# Patient Record
Sex: Female | Born: 1975 | Race: Black or African American | Hispanic: No | Marital: Single | Smoking: Never smoker
Health system: Southern US, Community
[De-identification: ages and names within clinical notes are randomized; demographics above are authoritative.]

## PROBLEM LIST (undated history)

## (undated) DIAGNOSIS — K219 Gastro-esophageal reflux disease without esophagitis: Secondary | ICD-10-CM

## (undated) DIAGNOSIS — G4730 Sleep apnea, unspecified: Secondary | ICD-10-CM

## (undated) DIAGNOSIS — M1990 Unspecified osteoarthritis, unspecified site: Secondary | ICD-10-CM

## (undated) DIAGNOSIS — J45909 Unspecified asthma, uncomplicated: Secondary | ICD-10-CM

## (undated) HISTORY — PX: FOOT SURGERY: SHX648

## (undated) HISTORY — PX: OTHER SURGICAL HISTORY: SHX169

## (undated) HISTORY — PX: BREAST BIOPSY: SHX20

## (undated) HISTORY — DX: Unspecified asthma, uncomplicated: J45.909

## (undated) HISTORY — DX: Unspecified osteoarthritis, unspecified site: M19.90

## (undated) NOTE — *Deleted (*Deleted)
It was great to see you!  Our plans for today:  - *** -   We are checking some labs today, I will call you if they are abnormal will send you a MyChart message or a letter if they are normal.  If you do not hear about your labs in the next 2 weeks please let us know.***  Take care and seek immediate care sooner if you develop any concerns.   Dr. [facility]

---

## 2010-06-30 HISTORY — PX: MYOMECTOMY: SHX85

## 2019-08-17 ENCOUNTER — Other Ambulatory Visit: Payer: Self-pay

## 2019-08-17 ENCOUNTER — Ambulatory Visit: Payer: Medicaid Other | Admitting: Family Medicine

## 2019-09-16 ENCOUNTER — Other Ambulatory Visit: Payer: Self-pay | Admitting: Family Medicine

## 2019-09-16 ENCOUNTER — Encounter: Payer: Self-pay | Admitting: Family Medicine

## 2019-09-16 ENCOUNTER — Ambulatory Visit (INDEPENDENT_AMBULATORY_CARE_PROVIDER_SITE_OTHER): Payer: Medicaid Other | Admitting: Family Medicine

## 2019-09-16 ENCOUNTER — Other Ambulatory Visit: Payer: Self-pay

## 2019-09-16 ENCOUNTER — Telehealth: Payer: Self-pay

## 2019-09-16 VITALS — BP 132/70 | HR 86 | Ht 59.0 in | Wt 161.5 lb

## 2019-09-16 DIAGNOSIS — Z9889 Other specified postprocedural states: Secondary | ICD-10-CM | POA: Diagnosis not present

## 2019-09-16 DIAGNOSIS — N911 Secondary amenorrhea: Secondary | ICD-10-CM | POA: Diagnosis not present

## 2019-09-16 DIAGNOSIS — Z8719 Personal history of other diseases of the digestive system: Secondary | ICD-10-CM | POA: Insufficient documentation

## 2019-09-16 DIAGNOSIS — N912 Amenorrhea, unspecified: Secondary | ICD-10-CM

## 2019-09-16 DIAGNOSIS — N63 Unspecified lump in unspecified breast: Secondary | ICD-10-CM

## 2019-09-16 DIAGNOSIS — R109 Unspecified abdominal pain: Secondary | ICD-10-CM | POA: Diagnosis not present

## 2019-09-16 DIAGNOSIS — Z23 Encounter for immunization: Secondary | ICD-10-CM

## 2019-09-16 DIAGNOSIS — Z0184 Encounter for antibody response examination: Secondary | ICD-10-CM

## 2019-09-16 DIAGNOSIS — L659 Nonscarring hair loss, unspecified: Secondary | ICD-10-CM | POA: Diagnosis not present

## 2019-09-16 HISTORY — DX: Unspecified abdominal pain: R10.9

## 2019-09-16 HISTORY — DX: Secondary amenorrhea: N91.1

## 2019-09-16 HISTORY — DX: Unspecified lump in unspecified breast: N63.0

## 2019-09-16 HISTORY — DX: Personal history of other diseases of the digestive system: Z87.19

## 2019-09-16 LAB — POCT URINE PREGNANCY: Preg Test, Ur: NEGATIVE

## 2019-09-16 NOTE — Assessment & Plan Note (Signed)
Reassuring physical exam.  We will follow-up with mammography for further characterization of breast mass.  She reports a known history of a cyst under her right nipple. -Follow-up mammography

## 2019-09-16 NOTE — Assessment & Plan Note (Signed)
Has mild stomach pain and mass with a known history of ventral hernia repair is suspicious for rib currents of her ventral hernia.  No obvious defects noted on palpation. -CT abdomen for further assessment -We will consider surgery referral based on CT results

## 2019-09-16 NOTE — Progress Notes (Signed)
    SUBJECTIVE:   CHIEF COMPLAINT / HPI:   Right breast mass with tenderness One of her biggest concerns for the visit today was a right breast mass in her right upper breast/armpit.  She has noticed this for roughly the past month and is concerned because there is some family history of cancer in the relatives of her mother.  She would like to be worked up for possible breast cancer.  Desire for vaccination Ms. Single is recently moved from [place] and is now trying to find employment medical field.  She is required to show evidence of immunization for MMR, varicella, hepatitis B, tetanus.  She has vaccine records with her today showing previous immunization with MMR and varicella.  She is not sure if she has previously been vaccinated hepatitis B and she does not know when her last tetanus vaccine was.  Ventral abdominal pain She is previously had a ventral hernia repair (unsure if this was with or without mesh).  In the past months, she is begun to notice mild abdominal pain and bulging just above her bellybutton.  The pain is a mild muscular pain is not sharp or excruciating.  She denies any changes in bowel movements.  Amenorrhea She is not sexually active since the birth of her 45-year-old daughter.  She has begun having irregular periods this past year.  PERTINENT  PMH / PSH: History of ventral hernia repair  OBJECTIVE:   BP 132/70   Pulse 86   Ht '4\' 11"'$  (1.499 m)   Wt 161 lb 8 oz (73.3 kg)   LMP 03/31/2019   SpO2 98%   BMI 32.62 kg/m    Breasts: No gross asymmetries noted.  Palpable mass noted in the right axilla to deep palpation.  1-1.5 cm in diameter, soft, mobile.  No skin changes.  No other abnormalities on breast exam bilaterally. Abdomen: Mild bulging of the epigastrium with neck flexion.  Mild tenderness to palpation of her stomach superior to the umbilicus.  Mild mass noted just superior to the umbilicus.  Difficult to palpate any obvious  defect.  ASSESSMENT/PLAN:   Amenorrhea, secondary Did not have time to fully work-up this concern.  She is encouraged to return to clinic at her leisure for a longer discussion and work-up. -We will follow-up urine pregnancy -Return to clinic for further discussion/work-up  Stomach pain Has mild stomach pain and mass with a known history of ventral hernia repair is suspicious for rib currents of her ventral hernia.  No obvious defects noted on palpation. -CT abdomen for further assessment -We will consider surgery referral based on CT results  Breast mass in female Reassuring physical exam.  We will follow-up with mammography for further characterization of breast mass.  She reports a known history of a cyst under her right nipple. -Follow-up mammography   Need for vaccination/immunity testing She is able to provide documentation of previous MMR/varicella vaccines. -MMR and varicella titers drawn today -Tetanus, hepatitis B and flu vaccinations provided today   April Haymaker, MD [place]

## 2019-09-16 NOTE — Patient Instructions (Signed)
I will let you know the results of the blood test that we get today.  I recommend signing up for my chart because I will give you access to the labs that we do today and be an easy way for you to get documentation of your vaccinations and immunity status.  With regard to your breast mass, I think that our findings today in clinic are reassuring.  I do think it is reasonable to get a mammogram and send her to the breast center to get further characterization of the mass.  With regard to your concern for recurrent abdominal hernia, I would like to get an abdominal CT to see if there is truly evidence of a hernia.  If we do find a repeat hernia, we will send you back to a surgeon.

## 2019-09-16 NOTE — Assessment & Plan Note (Addendum)
Did not have time to fully work-up this concern.  She is encouraged to return to clinic at her leisure for a longer discussion and work-up. -We will follow-up urine pregnancy -Return to clinic for further discussion/work-up

## 2019-09-19 LAB — TSH: TSH: 2.34 u[IU]/mL (ref 0.450–4.500)

## 2019-09-19 LAB — QUANTIFERON-TB GOLD PLUS
QuantiFERON Mitogen Value: >10 IU/mL
QuantiFERON Nil Value: 0.09 IU/mL
QuantiFERON TB1 Ag Value: 0.1 IU/mL
QuantiFERON TB2 Ag Value: 0.09 IU/mL
QuantiFERON-TB Gold Plus: NEGATIVE

## 2019-09-19 LAB — MEASLES/MUMPS/RUBELLA IMMUNITY
MUMPS ABS, IGG: >300 AU/mL (ref >10.9)
RUBEOLA AB, IGG: >300 AU/mL (ref >16.4)
Rubella Antibodies, IGG: 11.4 index (ref >0.99)

## 2019-09-19 LAB — VARICELLA ZOSTER ANTIBODY, IGG: Varicella zoster IgG: 786 index (ref >165)

## 2019-09-19 NOTE — Telephone Encounter (Signed)
Call pt and informed of appt. Pt understood. Salvatore Marvel, CMA

## 2019-09-20 ENCOUNTER — Encounter: Payer: Self-pay | Admitting: Family Medicine

## 2019-09-30 ENCOUNTER — Ambulatory Visit
Admission: RE | Admit: 2019-09-30 | Discharge: 2019-09-30 | Disposition: A | Discharge status: Home / Self Care | Payer: Medicaid Other | Source: Ambulatory Visit | Attending: Family Medicine | Admitting: Family Medicine

## 2019-09-30 DIAGNOSIS — Z8719 Personal history of other diseases of the digestive system: Secondary | ICD-10-CM

## 2019-10-03 ENCOUNTER — Telehealth: Payer: Self-pay

## 2019-10-03 NOTE — Telephone Encounter (Signed)
April Hardin from [facility] calls to give the following report:  12 mm nodule with punctate peripheral calcification in the inferior right breast. Mammographic correlation suggested.  PCP has already viewed results and has contacted patient regarding results. Patient is planning on having mammogram done.   To PCP as [name], RN

## 2019-10-10 ENCOUNTER — Telehealth (INDEPENDENT_AMBULATORY_CARE_PROVIDER_SITE_OTHER): Payer: Medicaid Other | Admitting: Family Medicine

## 2019-10-10 ENCOUNTER — Other Ambulatory Visit: Payer: Self-pay

## 2019-10-10 DIAGNOSIS — J45909 Unspecified asthma, uncomplicated: Secondary | ICD-10-CM | POA: Diagnosis not present

## 2019-10-10 DIAGNOSIS — J302 Other seasonal allergic rhinitis: Secondary | ICD-10-CM

## 2019-10-10 MED ORDER — CETIRIZINE HCL 10 MG PO TABS: 10.0000 mg | ORAL_TABLET | Freq: Every day | ORAL | 11 refills | Status: DC

## 2019-10-10 MED ORDER — ALBUTEROL SULFATE HFA 108 (90 BASE) MCG/ACT IN AERS: 2.0000 | INHALATION_SPRAY | Freq: Four times a day (QID) | RESPIRATORY_TRACT | 2 refills | Status: DC | PRN

## 2019-10-10 MED ORDER — OLOPATADINE HCL 0.1 % OP SOLN: 1.0000 [drp] | Freq: Two times a day (BID) | OPHTHALMIC | 12 refills | Status: DC

## 2019-10-10 NOTE — Assessment & Plan Note (Signed)
Mildly worsened in the setting of her recent allergy flare. -Albuterol refilled -She is encouraged to contact the clinic if she continues to need her inhaler 3-4 times a week once her allergies have resolved.

## 2019-10-10 NOTE — Assessment & Plan Note (Signed)
Allergy medications refilled. -Zyrtec -Patanol as needed for eye itchiness

## 2019-10-10 NOTE — Progress Notes (Signed)
[facility] Visit  Patient consented to have virtual visit and was identified by name and date of birth. Method of visit: Video  Encounter participants: Patient: April Hardin - located at home Provider: Matilde Haymaker - located at [facility] Others (if applicable): none  Chief Complaint: allergies  HPI:   Seasonal allergies April Hardin reports that she has had worsened itchy eyes, itchy throat, nasal congestion for the past 5 days.  Of the symptoms appear exactly her previous seasonal allergies.  She is currently taking Claritin with only mild symptom relief.  She was previously taking eyedrops that she has found very helpful.  She wants to know if she can be prescribed eyedrops today to help with her symptoms.  Breast mass She is aware that her recent abdominal CT also showed some calcification of her breast mass.  She is already reached out to the breast center and is planning for a mammogram and ultrasound as soon as it can be scheduled.    ROS: per HPI  Pertinent PMHx: Seasonal allergies  Exam:  There were no vitals taken for this visit.  General: Well-appearing middle-aged woman.  No acute distress. Respiratory: Breathing comfortably on room air.  Able to complete long sentences without effort.  Assessment/Plan:  Seasonal allergies Allergy medications refilled. -Zyrtec -Patanol as needed for eye itchiness  Asthma Mildly worsened in the setting of her recent allergy flare. -Albuterol refilled -She is encouraged to contact the clinic if she continues to need her inhaler 3-4 times a week once her allergies have resolved.    Time spent during visit with patient: 15 minutes

## 2019-10-31 ENCOUNTER — Other Ambulatory Visit: Payer: Medicaid Other

## 2019-11-10 ENCOUNTER — Ambulatory Visit
Admission: RE | Admit: 2019-11-10 | Discharge: 2019-11-10 | Disposition: A | Discharge status: Home / Self Care | Payer: Medicaid Other | Source: Ambulatory Visit | Attending: Family Medicine | Admitting: Family Medicine

## 2019-11-10 ENCOUNTER — Other Ambulatory Visit: Payer: Self-pay

## 2019-11-10 DIAGNOSIS — N63 Unspecified lump in unspecified breast: Secondary | ICD-10-CM

## 2020-02-17 ENCOUNTER — Other Ambulatory Visit: Payer: Self-pay

## 2020-02-17 ENCOUNTER — Other Ambulatory Visit: Payer: Medicaid Other

## 2020-02-17 DIAGNOSIS — Z20822 Contact with and (suspected) exposure to covid-19: Secondary | ICD-10-CM

## 2020-02-18 LAB — NOVEL CORONAVIRUS, NAA: SARS-CoV-2, NAA: NOT DETECTED

## 2020-02-18 LAB — SARS-COV-2, NAA 2 DAY TAT

## 2020-02-21 ENCOUNTER — Encounter: Payer: Self-pay | Admitting: Family Medicine

## 2020-05-21 ENCOUNTER — Other Ambulatory Visit: Payer: Self-pay

## 2020-05-21 ENCOUNTER — Emergency Department (HOSPITAL_COMMUNITY)
Admission: EM | Admit: 2020-05-21 | Discharge: 2020-05-21 | Disposition: A | Discharge status: Home / Self Care | Payer: Medicaid Other | Attending: Emergency Medicine | Admitting: Emergency Medicine

## 2020-05-21 ENCOUNTER — Encounter (HOSPITAL_COMMUNITY): Payer: Self-pay | Admitting: Emergency Medicine

## 2020-05-21 DIAGNOSIS — R519 Headache, unspecified: Secondary | ICD-10-CM | POA: Diagnosis not present

## 2020-05-21 DIAGNOSIS — R040 Epistaxis: Secondary | ICD-10-CM | POA: Insufficient documentation

## 2020-05-21 DIAGNOSIS — I10 Essential (primary) hypertension: Secondary | ICD-10-CM | POA: Insufficient documentation

## 2020-05-21 DIAGNOSIS — J45909 Unspecified asthma, uncomplicated: Secondary | ICD-10-CM | POA: Insufficient documentation

## 2020-05-21 NOTE — Progress Notes (Deleted)
    SUBJECTIVE:   CHIEF COMPLAINT / HPI:   Hypertension: Patient is a 44 y.o. female who present today for follow up of hypertension. ***  Patient endorses {rwdmsmartlistproblems:24882}  Patient not currently on hypertensive medication Patient endorses taking these medications as prescribed.***  Most recent creatinine trend: No results found for: CREATININE Patient {rwdoesdoesnot:24881} check blood pressure at home.  Patient {HAS HAS BSW:96759} had a BMP in the past 1 year.  PERTINENT  PMH / PSH: ***  OBJECTIVE:   There were no vitals taken for this visit.   General: NAD, pleasant, able to participate in exam Cardiac: RRR, no murmurs. Respiratory: CTAB, normal effort, No wheezes, rales or rhonchi Abdomen: Bowel sounds present, nontender, nondistended, no hepatosplenomegaly. Extremities: no edema or cyanosis. Skin: warm and dry, no rashes noted Neuro: alert, no obvious focal deficits Psych: Normal affect and mood  ASSESSMENT/PLAN:   No problem-specific Assessment & Plan notes found for this encounter.     Lurline Del, [place]    This note was prepared using Dragon voice recognition software and may include unintentional dictation errors due to the inherent limitations of voice recognition software.

## 2020-05-21 NOTE — ED Notes (Signed)
Patient verbalizes understanding of discharge instructions. Opportunity for questioning and answers were provided. Armband removed by staff, pt discharged from ED ambulatory.   

## 2020-05-21 NOTE — ED Triage Notes (Signed)
Pt reports heavy nose bleed this morning that lasted approx 20 mins. Got checked out by EMS but nose stopped bleeding so they advised her she didn't need transport, reports that she checked her BP and it was elevated, no hx of HTN. Not on blood thinners. No bleeding at this time.

## 2020-05-21 NOTE — ED Provider Notes (Signed)
[facility] EMERGENCY DEPARTMENT Provider Note  CSN: [id number] Arrival date & time: 05/21/20 1339    History Chief Complaint  Patient presents with  . Epistaxis  . Hypertension    HPI  April Hardin is a 44 y.o. female with no history of any significant medical problems reports this morning on the way to work she began to have a nosebleed mostly on the right nare, draining anteriorly and posteriorly. She went by a fire station where EMS checked her BP and it was elevated. While she was there her bleeding stopped and so she was not brought to the ED then. When she got home her BP was still elevated and so she came to the ED for evaluation. She has not had recurrence of her nosebleed. She had some L sided headache several weeks ago but none today. She has otherwise been in her normal state of health.    Past Medical History:  Diagnosis Date  . Arthritis   . Asthma     Past Surgical History:  Procedure Laterality Date  . BREAST BIOPSY Right   . CESAREAN SECTION  2016  . MYOMECTOMY  2012  . ventral hernia repair      Family History  Problem Relation Age of Onset  . Diabetes Mother   . Asthma Mother   . Asthma Sister   . Hypertension Brother   . Birth defects Paternal Grandmother   . Breast cancer Paternal Grandmother   . Breast cancer Cousin     Social History   Tobacco Use  . Smoking status: Not on file  Substance Use Topics  . Alcohol use: Not Currently  . Drug use: Not Currently     Home Medications Prior to Admission medications   Medication Sig Start Date End Date Taking? Authorizing Provider  albuterol (VENTOLIN HFA) 108 (90 Base) MCG/ACT inhaler Inhale 2 puffs into the lungs every 6 (six) hours as needed for wheezing or shortness of breath. 10/10/19   Matilde Haymaker, MD  cetirizine (ZYRTEC) 10 MG tablet Take 1 tablet (10 mg total) by mouth daily. 10/10/19   Matilde Haymaker, MD  olopatadine (PATANOL) 0.1 % ophthalmic solution Place 1 drop into both eyes 2  (two) times daily. 10/10/19   Matilde Haymaker, MD     Allergies    Patient has no known allergies.   Review of Systems   Review of Systems A comprehensive review of systems was completed and negative except as noted in HPI.    Physical Exam BP 130/71 (BP Location: Left Arm)   Pulse (!) 58   Temp 98.4 F (36.9 C) (Oral)   Resp 16   Ht 4\' 11"  (1.499 m)   Wt 68.5 kg   SpO2 100%   BMI 30.50 kg/m   Physical Exam Vitals and nursing note reviewed.  Constitutional:      Appearance: Normal appearance.  HENT:     Head: Normocephalic and atraumatic.     Nose: Nose normal.     Comments: Dried blood on R anterior nasal septum likely the source of her bleeding    Mouth/Throat:     Mouth: Mucous membranes are moist.  Eyes:     Extraocular Movements: Extraocular movements intact.     Conjunctiva/sclera: Conjunctivae normal.  Cardiovascular:     Rate and Rhythm: Normal rate.  Pulmonary:     Effort: Pulmonary effort is normal.     Breath sounds: Normal breath sounds.  Abdominal:     General: Abdomen is flat.  Palpations: Abdomen is soft.     Tenderness: There is no abdominal tenderness.  Musculoskeletal:        General: No swelling. Normal range of motion.     Cervical back: Neck supple.  Skin:    General: Skin is warm and dry.  Neurological:     General: No focal deficit present.     Mental Status: She is alert.  Psychiatric:        Mood and Affect: Mood normal.      ED Results / Procedures / Treatments   Labs (all labs ordered are listed, but only abnormal results are displayed) Labs Reviewed - No data to display  EKG None  Radiology No results found.  Procedures Procedures  Medications Ordered in the ED Medications - No data to display   MDM Rules/Calculators/A&P MDM BP mildly elevated on arrival but subsequent checks have been normal. She remains asymptomatic. Recommend she keep a log of BP at home to take to PCP, given management strategies for  nosebleed and advised to RTED if bleeding returns and she cannot get it to stop at home.  ED Course  I have reviewed the triage vital signs and the nursing notes.  Pertinent labs & imaging results that were available during my care of the patient were reviewed by me and considered in my medical decision making (see chart for details).     Final Clinical Impression(s) / ED Diagnoses Final diagnoses:  Hypertension, unspecified type  Anterior epistaxis    Rx / DC Orders ED Discharge Orders    None       Truddie Hidden, MD 05/21/20 2233

## 2020-05-22 ENCOUNTER — Ambulatory Visit: Payer: Medicaid Other

## 2020-05-23 ENCOUNTER — Ambulatory Visit (INDEPENDENT_AMBULATORY_CARE_PROVIDER_SITE_OTHER): Payer: Medicaid Other | Admitting: Family Medicine

## 2020-05-23 ENCOUNTER — Other Ambulatory Visit: Payer: Self-pay

## 2020-05-23 VITALS — BP 142/82 | HR 58 | Wt 154.6 lb

## 2020-05-23 DIAGNOSIS — R040 Epistaxis: Secondary | ICD-10-CM

## 2020-05-23 DIAGNOSIS — R030 Elevated blood-pressure reading, without diagnosis of hypertension: Secondary | ICD-10-CM

## 2020-05-23 MED ORDER — OXYMETAZOLINE HCL 0.05 % NA SOLN: 1.0000 | Freq: Every day | NASAL | 1 refills | Status: DC | PRN

## 2020-05-23 NOTE — Progress Notes (Signed)
    SUBJECTIVE:   CHIEF COMPLAINT / HPI:   Follow-up blood pressure  Nosebleed: Patient was seen 05/21/2020 in the emergency department regarding a nosebleed.  She reports that while on the way to work she began to have a nosebleed mostly on the right nare that was draining anteriorly and posteriorly.  She had gone by a local fire station where EMS checks her blood pressure, noted it was "elevated".  While patient was at the fire station her nosebleed stopped so therefore she was not brought to the emergency department.  However, the patient reports that when she got home her blood pressure was still elevated, so she came to the ED for evaluation.  She has not had a recurrence of her nosebleed.  Patient at the time reported having had a left-sided headache several weeks ago but none today.  At the time reported being in her normal state of health.  Elevated blood pressure: Patient reports that her blood pressure has been elevated recently, she reports that she checks it at home.  Patient does not have previous diagnosis of diabetes, I am unsure as to why she checks her blood pressure at home.  She believes it could be due to stress as her mother has recently come to visit her and is planning to stay for several weeks, however is planning to leave shortly after Thanksgiving.  Blood pressure today 142/82.  Health maintenance: Patient is due for hepatitis C screening,  HIV screening, and Pap smear.  Patient plans to follow-up in 2-3 weeks for blood pressure and will also do Pap smear, HIV and hep C screening.  Received flu shot 10/13, received COVID vaccine 04/11/2020 and 05/14/2020.    PERTINENT  PMH / PSH:  Seasonal allergies Asthma   OBJECTIVE:   BP (!) 142/82   Pulse (!) 58   Wt 154 lb 9.6 oz (70.1 kg)   SpO2 98%   BMI 31.23 kg/m    Physical exam: General: Well-appearing patient, very pleasant HEENT: Nasal turbinates with mild edema and erythema bilaterally, no active bleed or source of  bleed appreciated Respiratory: CTA bilaterally, comfortable work of breathing Cardio: RRR, S1-S2 present, no murmurs appreciated   ASSESSMENT/PLAN:   Blood pressure elevated without history of HTN Patient blood pressure today elevated at 142/82.  Patient without any concerning symptoms such as headaches, chest pain, shortness of breath.  Patient reports she has been very stressed recently since her mother is in town for several weeks to visit. -Recommend patient follows up for repeat blood pressure measurement after her mother has left town -Strict return precautions provided  Nosebleed Patient with recent nosebleed, most likely due to dry winter air.  Patient with history of seasonal allergies.  No bleed or source of bleeding was appreciated on physical exam at today's visit.  Patient given the following instructions: - Spray Afrin into the nostril that is having a bleed only when you're having a nose bleed. Do NOT use every day.  - I recommend humidifier, vasoline in nostril once daily as needed, and/or saline nasal spray to moisten nostril. - Follow-up 2-3 weeks or sooner as needed    Daisy Floro, [place]

## 2020-05-23 NOTE — Patient Instructions (Addendum)
Thank you for coming in to see April Hardin today! Please see below to review our plan for today's visit:  1. Spray Afrin into the nostril that is having a bleed only when you're having a nose bleed. Do NOT use every day. I recommend humidifier, vasoline in nostril once daily as needed, and/or saline nasal spray to moisten nostril. 2. Schedule an appt for Pap smear! 3. Follow up with April Hardin for Blood pressure in 2-3 weeks.  Please call the clinic at [phone] if your symptoms worsen or you have any concerns. It was our pleasure to serve you!   Dr. Milus Banister [facility]

## 2020-05-29 ENCOUNTER — Encounter: Payer: Self-pay | Admitting: Family Medicine

## 2020-05-29 DIAGNOSIS — R030 Elevated blood-pressure reading, without diagnosis of hypertension: Secondary | ICD-10-CM | POA: Insufficient documentation

## 2020-05-29 NOTE — Assessment & Plan Note (Signed)
Patient with recent nosebleed, most likely due to dry winter air.  Patient with history of seasonal allergies.  No bleed or source of bleeding was appreciated on physical exam at today's visit.  Patient given the following instructions: - Spray Afrin into the nostril that is having a bleed only when you're having a nose bleed. Do NOT use every day.  - I recommend humidifier, vasoline in nostril once daily as needed, and/or saline nasal spray to moisten nostril. - Follow-up 2-3 weeks or sooner as needed

## 2020-05-29 NOTE — Assessment & Plan Note (Signed)
Patient blood pressure today elevated at 142/82.  Patient without any concerning symptoms such as headaches, chest pain, shortness of breath.  Patient reports she has been very stressed recently since her mother is in town for several weeks to visit. -Recommend patient follows up for repeat blood pressure measurement after her mother has left town -Strict return precautions provided

## 2020-05-31 ENCOUNTER — Other Ambulatory Visit (HOSPITAL_COMMUNITY)
Admission: RE | Admit: 2020-05-31 | Discharge: 2020-05-31 | Disposition: A | Discharge status: Home / Self Care | Payer: Medicaid Other | Source: Ambulatory Visit | Attending: Family Medicine | Admitting: Family Medicine

## 2020-05-31 ENCOUNTER — Other Ambulatory Visit: Payer: Self-pay

## 2020-05-31 ENCOUNTER — Ambulatory Visit (INDEPENDENT_AMBULATORY_CARE_PROVIDER_SITE_OTHER): Payer: Medicaid Other | Admitting: Family Medicine

## 2020-05-31 VITALS — BP 120/60 | HR 67 | Ht 59.0 in | Wt 153.1 lb

## 2020-05-31 DIAGNOSIS — N898 Other specified noninflammatory disorders of vagina: Secondary | ICD-10-CM | POA: Diagnosis not present

## 2020-05-31 DIAGNOSIS — Z124 Encounter for screening for malignant neoplasm of cervix: Secondary | ICD-10-CM

## 2020-05-31 DIAGNOSIS — Z1159 Encounter for screening for other viral diseases: Secondary | ICD-10-CM

## 2020-05-31 DIAGNOSIS — Z0000 Encounter for general adult medical examination without abnormal findings: Secondary | ICD-10-CM | POA: Diagnosis not present

## 2020-05-31 DIAGNOSIS — Z5320 Procedure and treatment not carried out because of patient's decision for unspecified reasons: Secondary | ICD-10-CM

## 2020-05-31 DIAGNOSIS — N760 Acute vaginitis: Secondary | ICD-10-CM

## 2020-05-31 DIAGNOSIS — N951 Menopausal and female climacteric states: Secondary | ICD-10-CM | POA: Diagnosis not present

## 2020-05-31 DIAGNOSIS — B9689 Other specified bacterial agents as the cause of diseases classified elsewhere: Secondary | ICD-10-CM | POA: Diagnosis not present

## 2020-05-31 DIAGNOSIS — Z114 Encounter for screening for human immunodeficiency virus [HIV]: Secondary | ICD-10-CM | POA: Diagnosis not present

## 2020-05-31 LAB — POCT WET PREP (WET MOUNT)
Clue Cells Wet Prep Whiff POC: POSITIVE
Trichomonas Wet Prep HPF POC: ABSENT

## 2020-05-31 MED ORDER — METRONIDAZOLE 500 MG PO TABS: 500.0000 mg | ORAL_TABLET | Freq: Two times a day (BID) | ORAL | 0 refills | Status: DC

## 2020-05-31 NOTE — Assessment & Plan Note (Signed)
We discussed that her history and age are consistent with perimenopausal symptoms.  She was informed that it is not typically necessary to draw labs she was offered labs if she desired confirmation.  She was not interested in labs today.  She was informed that if she did begin to experience uncomfortable symptoms of menopause (hot flashes), she could return to clinic and we can discuss treatment options.  She declined a pregnancy test today.

## 2020-05-31 NOTE — Assessment & Plan Note (Signed)
-  Metronidazole 500 twice daily for 7 days

## 2020-05-31 NOTE — Patient Instructions (Signed)
Bacterial vaginosis: This is an overgrowth of your normal vaginal flora.  I recommend a course of antibiotics for 7 days which will improve your vaginal discharge.  Do not drink alcohol while taking this medication.  Pap smear: I will send you message by my chart about your results.  We can also discuss this at your next visit.  Irregular periods: Based on your age and history, this does sound like you are beginning menopause.  We do not have to do any testing at this time.  If you are begin to experience uncomfortable symptoms of menopause like hot flashes, he can return to clinic and we can discuss treatment options.

## 2020-05-31 NOTE — Progress Notes (Signed)
° ° °  SUBJECTIVE:   CHIEF COMPLAINT / HPI:   Health maintenance April Hardin presents to clinic today for a Pap smear.  She is not currently sexually active and her last intercourse was about 5 years ago.  She is not currently on any form of birth control.  Perimenopausal She reports that she has been having irregular periods for roughly the past 5 years.  Prior to the birth of her youngest daughter (about 5 years ago) she had very regular periods every month.  Following the birth of her daughter, her periods have remained inconsistent sometimes coming every month and sometimes coming every 5 months.  It has been about 2 months since her last menstrual period.  She is not sexually active and notes that there is no way that she could be pregnant right now.  She believes that her mother began to experience menopausal symptoms in her late 40s/early 72s.  PERTINENT  PMH / PSH: BMI over 30  OBJECTIVE:   BP 120/60    Pulse 67    Ht 4\' 11"  (1.499 m)    Wt 153 lb 2 oz (69.5 kg)    LMP 03/31/2019    SpO2 98%    BMI 30.93 kg/m    General: Lying comfortably on the exam table.  No acute distress. Respiratory: Breathing comfortably on room air.  No respiratory distress. Pelvic: Normal-appearing external genitalia.  Moist, pink vaginal mucosa.  White thin discharge coating the vaginal vault.  Samples taken.  Os visualized, Pap smear performed.  Normal bimanual exam without abnormal adnexa or fundal palpation.  ASSESSMENT/PLAN:   Perimenopausal We discussed that her history and age are consistent with perimenopausal symptoms.  She was informed that it is not typically necessary to draw labs she was offered labs if she desired confirmation.  She was not interested in labs today.  She was informed that if she did begin to experience uncomfortable symptoms of menopause (hot flashes), she could return to clinic and we can discuss treatment options.  She declined a pregnancy test today.  Bacterial  vaginosis -Metronidazole 500 twice daily for 7 days   Health maintenance -Follow-up HIV, hep C -Follow-up Pap smear  April Haymaker, MD [place]

## 2020-06-01 LAB — HIV ANTIBODY (ROUTINE TESTING W REFLEX): HIV Screen 4th Generation wRfx: NONREACTIVE

## 2020-06-01 LAB — HEPATITIS C ANTIBODY: Hep C Virus Ab: 0.1 s/co ratio (ref 0.0–0.9)

## 2020-06-05 LAB — CYTOLOGY - PAP
Adequacy: ABSENT
Comment: NEGATIVE
Diagnosis: NEGATIVE
High risk HPV: NEGATIVE

## 2020-06-07 ENCOUNTER — Ambulatory Visit: Payer: Medicaid Other | Admitting: Family Medicine

## 2020-06-11 ENCOUNTER — Ambulatory Visit: Payer: Medicaid Other

## 2020-07-26 ENCOUNTER — Encounter: Payer: Self-pay | Admitting: Family Medicine

## 2020-07-26 ENCOUNTER — Ambulatory Visit (INDEPENDENT_AMBULATORY_CARE_PROVIDER_SITE_OTHER): Payer: Medicaid Other | Admitting: Family Medicine

## 2020-07-26 ENCOUNTER — Other Ambulatory Visit: Payer: Self-pay

## 2020-07-26 VITALS — BP 136/80 | HR 78 | Wt 151.0 lb

## 2020-07-26 DIAGNOSIS — N951 Menopausal and female climacteric states: Secondary | ICD-10-CM

## 2020-07-26 DIAGNOSIS — G5603 Carpal tunnel syndrome, bilateral upper limbs: Secondary | ICD-10-CM

## 2020-07-26 DIAGNOSIS — Z1211 Encounter for screening for malignant neoplasm of colon: Secondary | ICD-10-CM

## 2020-07-26 DIAGNOSIS — G5600 Carpal tunnel syndrome, unspecified upper limb: Secondary | ICD-10-CM | POA: Insufficient documentation

## 2020-07-26 NOTE — Progress Notes (Signed)
    SUBJECTIVE:   CHIEF COMPLAINT / HPI:   Carpal tunnel syndrome April Hardin carries diagnosis of carpal tunnel syndrome.  When she lived in [place], she previously had nerve conduction studies performed which verified her carpal tunnel syndrome.  She currently uses 1 wrist splint at night which she alternates sides depending on which wrist bothers her more.  She is currently bothered by the tingling and numbness of her fingers.  This tingling and numbness is present bilaterally.  It is consistent with her previous carpal tunnel symptoms.  She wants to know what more can be done at this time.  History of hernia repair She wants to know if she should be observing any weight lifting limitations beyond a 30 pound weight limit.  Questions regarding menopause She notes that she has not had a.  For many months and would like to know if she is menopausal.  She would like to know if testing is necessary to see if she is experiencing menopause.  She is not having hot flashes or experiencing significant vaginal dryness.  PERTINENT  PMH / PSH: History of carpal tunnel, history of abdominal hernia repair  OBJECTIVE:   BP 136/80   Pulse 78   Wt 151 lb (68.5 kg)   SpO2 98%   BMI 30.50 kg/m   General: Alert and cooperative and appears to be in no acute distress Respiratory: Breathing comfortably on room air.  No respiratory distress. Extremities: No peripheral edema. Warm/ well perfused.  Strong radial pulses. Neuro: Cranial nerves grossly intact  ASSESSMENT/PLAN:   Carpal tunnel syndrome She was encouraged to use response bilaterally every night for the next 6 weeks and encouraged to use Motrin for bothersome symptoms.  If she did not experience any improvement in the next 6 weeks, she could return to clinic at which time we would consider steroid injections into her wrist.  I do not think it is necessary to consider nerve conduction studies at this time because we're not considering surgical  options at this time. -Response nightly -Motrin as needed  Perimenopausal She was informed that there is no testing necessary to assess for her menopausal symptoms.  Based on her age and history, she is most likely experiencing menopause.  If she does begin having hot flashes or vaginal dryness, this her symptoms that we can help with.  She was encouraged to return to clinic to discuss those symptoms if they begin.   History of abdominal hernia CT abdomen from 2021 showed no hernia present at that time.  She was informed that she does not currently have a hernia that she needs to be concerned about.  She was encouraged to continue to observe her 30 pound weight lifting limit.  April Haymaker, MD [place]

## 2020-07-26 NOTE — Patient Instructions (Signed)
Carpal tunnel syndrome: I'm sorry this seems to be getting a little bit worse lately.  I will give you a prescription for wrist splints.  You can take the prescription to a medical supply store or simply go to a local pharmacy to buy another wrist plan to wear at night.  Please wear your response every night for at least 6 weeks.  I also recommend taking over-the-counter Motrin for any additional discomfort, this can help with inflammation and improve your symptoms of carpal tunnel syndrome.  If you end up getting worse, we can talk about next steps like steroid injection, nerve testing and surgery.  Weight lifting restrictions: I think your 30 pound weight lifting restriction is appropriate, I would not add any more strict requirement at this time.  Menopause: I'm glad that you're not having any significant symptoms of menopause.  If you do start to experience hot flashes or vaginal dryness, those are things that we can help with.  I have placed a referral to GI for a colonoscopy you should get a call in the next 1-2 weeks to set up your appointment.  Please let me know if you have not received a call in the next 2 weeks.

## 2020-07-26 NOTE — Assessment & Plan Note (Signed)
She was encouraged to use response bilaterally every night for the next 6 weeks and encouraged to use Motrin for bothersome symptoms.  If she did not experience any improvement in the next 6 weeks, she could return to clinic at which time we would consider steroid injections into her wrist.  I do not think it is necessary to consider nerve conduction studies at this time because we're not considering surgical options at this time. -Response nightly -Motrin as needed

## 2020-07-26 NOTE — Assessment & Plan Note (Signed)
She was informed that there is no testing necessary to assess for her menopausal symptoms.  Based on her age and history, she is most likely experiencing menopause.  If she does begin having hot flashes or vaginal dryness, this her symptoms that we can help with.  She was encouraged to return to clinic to discuss those symptoms if they begin.

## 2020-08-21 ENCOUNTER — Encounter: Payer: Self-pay | Admitting: Internal Medicine

## 2020-09-24 ENCOUNTER — Other Ambulatory Visit: Payer: Self-pay

## 2020-09-24 ENCOUNTER — Telehealth: Payer: Self-pay | Admitting: *Deleted

## 2020-09-24 ENCOUNTER — Ambulatory Visit (AMBULATORY_SURGERY_CENTER): Payer: Medicaid Other | Admitting: *Deleted

## 2020-09-24 VITALS — Ht 59.0 in | Wt 150.0 lb

## 2020-09-24 DIAGNOSIS — Z1211 Encounter for screening for malignant neoplasm of colon: Secondary | ICD-10-CM

## 2020-09-24 MED ORDER — NA SULFATE-K SULFATE-MG SULF 17.5-3.13-1.6 GM/177ML PO SOLN: 1.0000 | Freq: Once | ORAL | 0 refills | Status: AC

## 2020-09-24 NOTE — Telephone Encounter (Signed)
Virtual pre visit completed. Instructions for colonoscopy mailed.

## 2020-09-24 NOTE — Progress Notes (Signed)
No egg or soy allergy known to patient  No issues with past sedation with any surgeries or procedures Patient denies ever being told they had issues or difficulty with intubation  No FH of Malignant Hyperthermia No diet pills per patient No home 02 use per patient  No blood thinners per patient  Pt denies issues with constipation  No A fib or A flutter  EMMI video to pt or via [place] 19 guidelines implemented in [place] today with Pt and RN  Pt is fully vaccinated  for Covid   Virtual pre visit completed. Instructions mailed.   Due to the COVID-19 pandemic we are asking patients to follow certain guidelines.  Pt aware of COVID protocols and [facility] guidelines

## 2020-10-02 ENCOUNTER — Encounter: Payer: Self-pay | Admitting: Internal Medicine

## 2020-10-02 ENCOUNTER — Other Ambulatory Visit: Payer: Self-pay

## 2020-10-02 ENCOUNTER — Ambulatory Visit (AMBULATORY_SURGERY_CENTER): Payer: Medicaid Other | Admitting: Internal Medicine

## 2020-10-02 VITALS — BP 157/76 | HR 65 | Temp 98.4°F | Resp 11 | Ht 59.0 in | Wt 150.0 lb

## 2020-10-02 DIAGNOSIS — Z1211 Encounter for screening for malignant neoplasm of colon: Secondary | ICD-10-CM | POA: Diagnosis not present

## 2020-10-02 MED ORDER — SODIUM CHLORIDE 0.9 % IV SOLN: 500.0000 mL | Freq: Once | INTRAVENOUS | Status: DC

## 2020-10-02 NOTE — Op Note (Signed)
[facility] Patient Name: Jordy Hewins Procedure Date: 10/02/2020 10:40 AM MRN: [id number] Endoscopist: Gatha Mayer , MD Age: 45 Referring MD:  Date of Birth: [birth year] Gender: Female Account #: [id number] Procedure:                Colonoscopy Indications:              Screening for colorectal malignant neoplasm, This                            is the patient's first colonoscopy Medicines:                Propofol per Anesthesia, Monitored Anesthesia Care Procedure:                Pre-Anesthesia Assessment:                           - Prior to the procedure, a History and Physical                            was performed, and patient medications and                            allergies were reviewed. The patient's tolerance of                            previous anesthesia was also reviewed. The risks                            and benefits of the procedure and the sedation                            options and risks were discussed with the patient.                            All questions were answered, and informed consent                            was obtained. Prior Anticoagulants: The patient has                            taken no previous anticoagulant or antiplatelet                            agents. ASA Grade Assessment: II - A patient with                            mild systemic disease. After reviewing the risks                            and benefits, the patient was deemed in                            satisfactory condition to undergo the procedure.  After obtaining informed consent, the colonoscope                            was passed under direct vision. Throughout the                            procedure, the patient's blood pressure, pulse, and                            oxygen saturations were monitored continuously. The                            Olympus PFC-[id number] (#[id number]) Colonoscope was                             introduced through the anus and advanced to the the                            terminal ileum, with identification of the                            appendiceal orifice and IC valve. The terminal                            ileum, ileocecal valve, appendiceal orifice, and                            rectum were photographed. The quality of the bowel                            preparation was excellent. The colonoscopy was                            performed without difficulty. The patient tolerated                            the procedure well. The bowel preparation used was                            SUPREP via split dose instruction. Scope In: 10:52:50 AM Scope Out: 11:02:26 AM Scope Withdrawal Time: 0 hours 7 minutes 4 seconds  Total Procedure Duration: 0 hours 9 minutes 36 seconds  Findings:                 The perianal and digital rectal examinations were                            normal.                           The colon (entire examined portion) appeared normal.                           No additional abnormalities were found on  retroflexion.                           The terminal ileum appeared normal. Complications:            No immediate complications. Estimated blood loss:                            None. Estimated Blood Loss:     Estimated blood loss: none. Impression:               - The entire examined colon is normal.                           - The examined portion of the ileum was normal.                           - No specimens collected. Recommendation:           - Repeat colonoscopy in 10 years for screening                            purposes.                           - Patient has a contact number available for                            emergencies. The signs and symptoms of potential                            delayed complications were discussed with the                            patient. Return to normal activities tomorrow.                             Written discharge instructions were provided to the                            patient.                           - Resume previous diet.                           - Continue present medications. Gatha Mayer, MD 10/02/2020 11:07:59 AM This report has been signed electronically.

## 2020-10-02 NOTE — Progress Notes (Signed)
A/ox3, pleased with MAC, report to RN 

## 2020-10-02 NOTE — Progress Notes (Signed)
Vital signs by CW. Pt's states no medical or surgical changes since previsit or office visit.

## 2020-10-02 NOTE — Patient Instructions (Addendum)
Colonoscopy ws normal - no polyps, no cancer seen. Excellent prep.  Next routine colonoscopy or other screening test in 10 years - 2032.  I appreciate the opportunity to care for you. Gatha Mayer, MD, [facility]  You can use OTC gas products if gas is still a concern.  YOU HAD AN ENDOSCOPIC PROCEDURE TODAY AT [facility]:   Refer to the procedure report that was given to you for any specific questions about what was found during the examination.  If the procedure report does not answer your questions, please call your gastroenterologist to clarify.  If you requested that your care partner not be given the details of your procedure findings, then the procedure report has been included in a sealed envelope for you to review at your convenience later.  YOU SHOULD EXPECT: Some feelings of bloating in the abdomen. Passage of more gas than usual.  Walking can help get rid of the air that was put into your GI tract during the procedure and reduce the bloating. If you had a lower endoscopy (such as a colonoscopy or flexible sigmoidoscopy) you may notice spotting of blood in your stool or on the toilet paper. If you underwent a bowel prep for your procedure, you may not have a normal bowel movement for a few days.  Please Note:  You might notice some irritation and congestion in your nose or some drainage.  This is from the oxygen used during your procedure.  There is no need for concern and it should clear up in a day or so.  SYMPTOMS TO REPORT IMMEDIATELY:   Following lower endoscopy (colonoscopy or flexible sigmoidoscopy):  Excessive amounts of blood in the stool  Significant tenderness or worsening of abdominal pains  Swelling of the abdomen that is new, acute  Fever of 100F or higher   For urgent or emergent issues, a gastroenterologist can be reached at any hour by calling [phone]. Do not use MyChart messaging for urgent concerns.    DIET:  We do recommend a small  meal at first, but then you may proceed to your regular diet.  Drink plenty of fluids but you should avoid alcoholic beverages for 24 hours.  ACTIVITY:  You should plan to take it easy for the rest of today and you should NOT DRIVE or use heavy machinery until tomorrow (because of the sedation medicines used during the test).    FOLLOW UP: Our staff will call the number listed on your records 48-72 hours following your procedure to check on you and address any questions or concerns that you may have regarding the information given to you following your procedure. If we do not reach you, we will leave a message.  We will attempt to reach you two times.  During this call, we will ask if you have developed any symptoms of COVID 19. If you develop any symptoms (ie: fever, flu-like symptoms, shortness of breath, cough etc.) before then, please call [phone].  If you test positive for Covid 19 in the 2 weeks post procedure, please call and report this information to [place].     SIGNATURES/CONFIDENTIALITY: You and/or your care partner have signed paperwork which will be entered into your electronic medical record.  These signatures attest to the fact that that the information above on your After Visit Summary has been reviewed and is understood.  Full responsibility of the confidentiality of this discharge information lies with you and/or your care-partner.

## 2020-10-04 ENCOUNTER — Telehealth: Payer: Self-pay | Admitting: *Deleted

## 2020-10-04 NOTE — Telephone Encounter (Signed)
  Follow up Call-  Call back number 10/02/2020  Post procedure Call Back phone  # [phone]  Permission to leave phone message Yes     Patient questions:  Do you have a fever, pain , or abdominal swelling? No. Pain Score  0 *  Have you tolerated food without any problems? Yes.    Have you been able to return to your normal activities? Yes.    Do you have any questions about your discharge instructions: Diet   No. Medications  No. Follow up visit  No.  Do you have questions or concerns about your Care? No.  Actions: * If pain score is 4 or above: No action needed, pain <4.  1. Have you developed a fever since your procedure? no  2.   Have you had an respiratory symptoms (SOB or cough) since your procedure? no  3.   Have you tested positive for COVID 19 since your procedure no  4.   Have you had any family members/close contacts diagnosed with the COVID 19 since your procedure?  no   If yes to any of these questions please route to Joylene John, RN and Joella Prince, RN

## 2020-10-17 ENCOUNTER — Other Ambulatory Visit: Payer: Self-pay | Admitting: Family Medicine

## 2020-10-17 DIAGNOSIS — Z1231 Encounter for screening mammogram for malignant neoplasm of breast: Secondary | ICD-10-CM

## 2020-12-10 ENCOUNTER — Ambulatory Visit
Admission: RE | Admit: 2020-12-10 | Discharge: 2020-12-10 | Disposition: A | Discharge status: Home / Self Care | Payer: Medicaid Other | Source: Ambulatory Visit | Attending: Psychiatry | Admitting: Psychiatry

## 2020-12-10 ENCOUNTER — Other Ambulatory Visit: Payer: Self-pay

## 2020-12-10 DIAGNOSIS — Z1231 Encounter for screening mammogram for malignant neoplasm of breast: Secondary | ICD-10-CM

## 2020-12-13 ENCOUNTER — Other Ambulatory Visit: Payer: Self-pay | Admitting: Psychiatry

## 2020-12-13 ENCOUNTER — Other Ambulatory Visit: Payer: Self-pay | Admitting: *Deleted

## 2020-12-13 DIAGNOSIS — R928 Other abnormal and inconclusive findings on diagnostic imaging of breast: Secondary | ICD-10-CM

## 2020-12-25 ENCOUNTER — Other Ambulatory Visit: Payer: Self-pay | Admitting: Psychiatry

## 2020-12-25 ENCOUNTER — Other Ambulatory Visit: Payer: Self-pay | Admitting: Family Medicine

## 2020-12-25 DIAGNOSIS — R928 Other abnormal and inconclusive findings on diagnostic imaging of breast: Secondary | ICD-10-CM

## 2021-01-09 ENCOUNTER — Other Ambulatory Visit: Payer: Self-pay | Admitting: Student

## 2021-01-09 DIAGNOSIS — R928 Other abnormal and inconclusive findings on diagnostic imaging of breast: Secondary | ICD-10-CM

## 2021-01-14 ENCOUNTER — Other Ambulatory Visit: Payer: Self-pay | Admitting: Student

## 2021-01-14 ENCOUNTER — Other Ambulatory Visit: Payer: Self-pay

## 2021-01-14 ENCOUNTER — Ambulatory Visit
Admission: RE | Admit: 2021-01-14 | Discharge: 2021-01-14 | Disposition: A | Discharge status: Home / Self Care | Payer: Medicaid Other | Source: Ambulatory Visit | Attending: *Deleted | Admitting: *Deleted

## 2021-01-14 ENCOUNTER — Ambulatory Visit
Admission: RE | Admit: 2021-01-14 | Discharge: 2021-01-14 | Disposition: A | Discharge status: Home / Self Care | Payer: Medicaid Other | Source: Ambulatory Visit | Attending: Family Medicine | Admitting: Family Medicine

## 2021-01-14 DIAGNOSIS — N6320 Unspecified lump in the left breast, unspecified quadrant: Secondary | ICD-10-CM

## 2021-01-14 DIAGNOSIS — R928 Other abnormal and inconclusive findings on diagnostic imaging of breast: Secondary | ICD-10-CM

## 2021-01-23 ENCOUNTER — Ambulatory Visit
Admission: RE | Admit: 2021-01-23 | Discharge: 2021-01-23 | Disposition: A | Discharge status: Home / Self Care | Payer: Medicaid Other | Source: Ambulatory Visit | Attending: Family Medicine | Admitting: Family Medicine

## 2021-01-23 ENCOUNTER — Other Ambulatory Visit: Payer: Self-pay

## 2021-01-23 DIAGNOSIS — N6320 Unspecified lump in the left breast, unspecified quadrant: Secondary | ICD-10-CM

## 2021-02-18 ENCOUNTER — Ambulatory Visit: Payer: Self-pay | Admitting: Surgery

## 2021-02-18 DIAGNOSIS — D242 Benign neoplasm of left breast: Secondary | ICD-10-CM

## 2021-02-18 NOTE — H&P (Signed)
REFERRING PHYSICIAN:  Tiburcio Pea*  PROVIDER:  Carlean Jews, MD  MRN: [id number] DOB: 1975/10/29 DATE OF ENCOUNTER: 02/18/2021  Subjective   Chief Complaint: Breast Mass (Left intraductal papilloma)     History of Present Illness: April Hardin is a 45 y.o. female who is seen today as an office consultation at the request of Dr. Erin Hearing for evaluation of Breast Mass (Left intraductal papilloma) .   This is a 45 year old female in good health who presents with several years of a skin tag below her left breast.  She also recently had a routine screening mammogram that showed a possible mass in the left breast.  Further work-up showed a 1.2 x 0.4 x 0.7 mass at 11:00 in the left breast located 3 cm from the nipple.  This was biopsied and revealed intraductal papilloma with no atypia or malignancy.  The patient has a family history of breast cancer and some cousins.  She presents now to discuss excision.  She would also like to have a skin tag removed.   Review of Systems: A complete review of systems was obtained from the patient.  I have reviewed this information and discussed as appropriate with the patient.  See HPI as well for other ROS.  Review of Systems  Constitutional: Negative.   HENT: Negative.   Eyes: Negative.   Respiratory: Negative.   Cardiovascular: Negative.   Gastrointestinal: Negative.   Genitourinary: Negative.   Musculoskeletal: Negative.   Skin: Negative.   Neurological: Negative.   Endo/Heme/Allergies: Negative.   Psychiatric/Behavioral: Negative.       Medical History: Past Medical History:  Diagnosis Date   GERD (gastroesophageal reflux disease)    Sleep apnea     Patient Active Problem List  Diagnosis   Asthma   Carpal tunnel syndrome   Perimenopausal   Seasonal allergies    Past Surgical History:  Procedure Laterality Date   HERNIA REPAIR     myoectomy       No Known Allergies  No current outpatient medications  on file prior to visit.   No current facility-administered medications on file prior to visit.    Family History  Problem Relation Age of Onset   Stroke Mother    Diabetes Mother    Stomach cancer Mother      Social History   Tobacco Use  Smoking Status Never Smoker  Smokeless Tobacco Never Used     Social History   Socioeconomic History   Marital status: Single  Tobacco Use   Smoking status: Never Smoker   Smokeless tobacco: Never Used  Substance and Sexual Activity   Alcohol use: Never   Drug use: Never    Objective:    Vitals:   02/18/21 1138  BP: (!) 156/90  Pulse: 76  SpO2: 98%  Weight: 70.9 kg (156 lb 3.2 oz)  Height: 149.9 cm ('4\' 11"'$ )    Body mass index is 31.55 kg/m.  Physical Exam   Constitutional:  WDWN in NAD, conversant, no obvious deformities; lying in bed comfortably Eyes:  Pupils equal, round; sclera anicteric; moist conjunctiva; no lid lag HENT:  Oral mucosa moist; good dentition  Neck:  No masses palpated, trachea midline; no thyromegaly Lungs:  CTA bilaterally; normal respiratory effort Breasts:  symmetric, no nipple changes; no palpable masses or lymphadenopathy on either side; bilateral fibrocystic changes CV:  Regular rate and rhythm; no murmurs; extremities well-perfused with no edema Abd:  +bowel sounds, soft, non-tender, no palpable organomegaly; no palpable hernias Musc:  Unable to assess gait; no apparent clubbing or cyanosis in extremities Lymphatic:  No palpable cervical or axillary lymphadenopathy Skin:  Warm, dry; no sign of jaundice; 1 cm protruding skin tag below the left breast on the chest wall Psychiatric - alert and oriented x 4; calm mood and affect   Labs, Imaging and Diagnostic Testing: CLINICAL DATA:  Patient returns today to evaluate a possible LEFT breast mass identified on recent screening mammogram.   EXAM: DIGITAL DIAGNOSTIC UNILATERAL LEFT MAMMOGRAM WITH TOMOSYNTHESIS AND CAD; ULTRASOUND LEFT BREAST  LIMITED   TECHNIQUE: Left digital diagnostic mammography and breast tomosynthesis was performed. The images were evaluated with computer-aided detection.; Targeted ultrasound examination of the left breast was performed   COMPARISON:  Previous exam(s).   ACR Breast Density Category c: The breast tissue is heterogeneously dense, which may obscure small masses.   FINDINGS: LEFT breast diagnostic mammogram: On today's additional diagnostic views, a new oval circumscribed low-density mass is confirmed within the upper subareolar LEFT breast, measuring approximately 1.1 cm greatest dimension.   Targeted ultrasound is performed, showing an oval circumscribed hypoechoic mass in the LEFT breast at the 11 o'clock axis, 3 cm from the nipple, measuring 1.2 x 0.4 x 0.7 cm, corresponding to the mammographic finding.   LEFT axilla was evaluated with ultrasound showing no enlarged or morphologically abnormal lymph nodes.   IMPRESSION: Oval circumscribed hypoechoic mass in the LEFT breast at the 11 o'clock axis, 3 cm from the nipple, measuring 1.2 cm, corresponding to a new mammographic finding. This may represent a benign fibroadenoma. Ultrasound-guided biopsy is recommended to exclude malignancy.   RECOMMENDATION: Ultrasound-guided biopsy for the LEFT breast mass at the 11 o'clock axis.   Ultrasound-guided biopsy is scheduled for July 27th.   I have discussed the findings and recommendations with the patient. If applicable, a reminder letter will be sent to the patient regarding the next appointment.   BI-RADS CATEGORY  4: Suspicious.     Electronically Signed   By: Franki Cabot M.D.   On: 01/14/2021 15:46  Assessment and Plan:  Diagnoses and all orders for this visit:  Intraductal papilloma of breast, left  Skin tag Comments: Left chest wall 1 cm     Left seed localized lumpectomy/ excision of left chest wall skin tag.The surgical procedure has been discussed with the  patient.  Potential risks, benefits, alternative treatments, and expected outcomes have been explained.  All of the patient's questions at this time have been answered.  The likelihood of reaching the patient's treatment goal is good.  The patient understand the proposed surgical procedure and wishes to proceed.   No follow-ups on file.  Carlean Jews, MD  02/18/2021 12:18 PM

## 2021-02-21 ENCOUNTER — Other Ambulatory Visit: Payer: Self-pay | Admitting: Surgery

## 2021-02-21 DIAGNOSIS — D242 Benign neoplasm of left breast: Secondary | ICD-10-CM

## 2021-03-12 ENCOUNTER — Encounter (HOSPITAL_BASED_OUTPATIENT_CLINIC_OR_DEPARTMENT_OTHER): Payer: Self-pay | Admitting: Surgery

## 2021-03-12 ENCOUNTER — Other Ambulatory Visit: Payer: Self-pay

## 2021-03-19 NOTE — Progress Notes (Signed)

## 2021-03-20 ENCOUNTER — Ambulatory Visit
Admission: RE | Admit: 2021-03-20 | Discharge: 2021-03-20 | Disposition: A | Discharge status: Home / Self Care | Payer: Medicaid Other | Source: Ambulatory Visit | Attending: Surgery | Admitting: Surgery

## 2021-03-20 ENCOUNTER — Other Ambulatory Visit: Payer: Self-pay

## 2021-03-20 DIAGNOSIS — D242 Benign neoplasm of left breast: Secondary | ICD-10-CM

## 2021-03-21 ENCOUNTER — Encounter (HOSPITAL_BASED_OUTPATIENT_CLINIC_OR_DEPARTMENT_OTHER): Payer: Self-pay | Admitting: Surgery

## 2021-03-21 ENCOUNTER — Encounter (HOSPITAL_BASED_OUTPATIENT_CLINIC_OR_DEPARTMENT_OTHER)
Admission: RE | Disposition: A | Discharge status: Home / Self Care | Payer: Self-pay | Source: Home / Self Care | Attending: Surgery

## 2021-03-21 ENCOUNTER — Ambulatory Visit (HOSPITAL_BASED_OUTPATIENT_CLINIC_OR_DEPARTMENT_OTHER): Payer: Medicaid Other | Admitting: Anesthesiology

## 2021-03-21 ENCOUNTER — Ambulatory Visit (HOSPITAL_BASED_OUTPATIENT_CLINIC_OR_DEPARTMENT_OTHER)
Admission: RE | Admit: 2021-03-21 | Discharge: 2021-03-21 | Disposition: A | Discharge status: Home / Self Care | Payer: Medicaid Other | Attending: Surgery | Admitting: Surgery

## 2021-03-21 ENCOUNTER — Ambulatory Visit
Admission: RE | Admit: 2021-03-21 | Discharge: 2021-03-21 | Disposition: A | Discharge status: Home / Self Care | Payer: Medicaid Other | Source: Ambulatory Visit | Attending: Surgery | Admitting: Surgery

## 2021-03-21 ENCOUNTER — Other Ambulatory Visit: Payer: Self-pay

## 2021-03-21 DIAGNOSIS — Z803 Family history of malignant neoplasm of breast: Secondary | ICD-10-CM | POA: Insufficient documentation

## 2021-03-21 DIAGNOSIS — D242 Benign neoplasm of left breast: Secondary | ICD-10-CM | POA: Insufficient documentation

## 2021-03-21 DIAGNOSIS — Z800 Family history of malignant neoplasm of digestive organs: Secondary | ICD-10-CM | POA: Insufficient documentation

## 2021-03-21 DIAGNOSIS — D235 Other benign neoplasm of skin of trunk: Secondary | ICD-10-CM | POA: Diagnosis not present

## 2021-03-21 DIAGNOSIS — L918 Other hypertrophic disorders of the skin: Secondary | ICD-10-CM | POA: Diagnosis not present

## 2021-03-21 DIAGNOSIS — N6012 Diffuse cystic mastopathy of left breast: Secondary | ICD-10-CM | POA: Diagnosis not present

## 2021-03-21 HISTORY — PX: EXCISION OF SKIN TAG: SHX6270

## 2021-03-21 HISTORY — DX: Gastro-esophageal reflux disease without esophagitis: K21.9

## 2021-03-21 HISTORY — DX: Sleep apnea, unspecified: G47.30

## 2021-03-21 HISTORY — PX: BREAST LUMPECTOMY WITH RADIOACTIVE SEED LOCALIZATION: SHX6424

## 2021-03-21 SURGERY — BREAST LUMPECTOMY WITH RADIOACTIVE SEED LOCALIZATION
Anesthesia: General | Site: Chest | Laterality: Left

## 2021-03-21 MED ORDER — ONDANSETRON HCL 4 MG/2ML IJ SOLN
INTRAMUSCULAR | Status: AC
  Filled 2021-03-21: qty 2

## 2021-03-21 MED ORDER — MEPERIDINE HCL 25 MG/ML IJ SOLN: 6.2500 mg | INTRAMUSCULAR | Status: DC | PRN

## 2021-03-21 MED ORDER — DEXAMETHASONE SODIUM PHOSPHATE 10 MG/ML IJ SOLN
INTRAMUSCULAR | Status: DC | PRN
  Administered 2021-03-21: 4 mg via INTRAVENOUS

## 2021-03-21 MED ORDER — CHLORHEXIDINE GLUCONATE CLOTH 2 % EX PADS: 6.0000 | MEDICATED_PAD | Freq: Once | CUTANEOUS | Status: DC

## 2021-03-21 MED ORDER — CEFAZOLIN SODIUM-DEXTROSE 2-4 GM/100ML-% IV SOLN
INTRAVENOUS | Status: AC
  Filled 2021-03-21: qty 100

## 2021-03-21 MED ORDER — FENTANYL CITRATE (PF) 100 MCG/2ML IJ SOLN
INTRAMUSCULAR | Status: DC | PRN
  Administered 2021-03-21: 50 ug via INTRAVENOUS
  Administered 2021-03-21: 25 ug via INTRAVENOUS

## 2021-03-21 MED ORDER — SCOPOLAMINE 1 MG/3DAYS TD PT72
MEDICATED_PATCH | TRANSDERMAL | Status: AC
  Filled 2021-03-21: qty 1

## 2021-03-21 MED ORDER — ACETAMINOPHEN 500 MG PO TABS
ORAL_TABLET | ORAL | Status: AC
  Filled 2021-03-21: qty 2

## 2021-03-21 MED ORDER — CEFAZOLIN SODIUM-DEXTROSE 2-4 GM/100ML-% IV SOLN
2.0000 g | INTRAVENOUS | Status: AC
  Administered 2021-03-21: 2 g via INTRAVENOUS

## 2021-03-21 MED ORDER — FENTANYL CITRATE (PF) 100 MCG/2ML IJ SOLN
INTRAMUSCULAR | Status: AC
  Filled 2021-03-21: qty 2

## 2021-03-21 MED ORDER — DEXAMETHASONE SODIUM PHOSPHATE 10 MG/ML IJ SOLN
INTRAMUSCULAR | Status: AC
  Filled 2021-03-21: qty 1

## 2021-03-21 MED ORDER — OXYCODONE HCL 5 MG/5ML PO SOLN
5.0000 mg | Freq: Once | ORAL | Status: DC | PRN
Start: 2021-03-21 — End: 2021-03-21

## 2021-03-21 MED ORDER — MIDAZOLAM HCL 2 MG/2ML IJ SOLN: 0.5000 mg | Freq: Once | INTRAMUSCULAR | Status: DC | PRN

## 2021-03-21 MED ORDER — BUPIVACAINE-EPINEPHRINE 0.25% -1:200000 IJ SOLN
INTRAMUSCULAR | Status: DC | PRN
  Administered 2021-03-21: 10 mL

## 2021-03-21 MED ORDER — LIDOCAINE HCL (PF) 2 % IJ SOLN
INTRAMUSCULAR | Status: AC
  Filled 2021-03-21: qty 5

## 2021-03-21 MED ORDER — PROPOFOL 10 MG/ML IV BOLUS
INTRAVENOUS | Status: AC
  Filled 2021-03-21: qty 20

## 2021-03-21 MED ORDER — EPHEDRINE SULFATE-NACL 50-0.9 MG/10ML-% IV SOSY
PREFILLED_SYRINGE | INTRAVENOUS | Status: DC | PRN
  Administered 2021-03-21 (×2): 10 mg via INTRAVENOUS

## 2021-03-21 MED ORDER — PROPOFOL 10 MG/ML IV BOLUS
INTRAVENOUS | Status: DC | PRN
Start: 2021-03-21 — End: 2021-03-21
  Administered 2021-03-21: 200 mg via INTRAVENOUS

## 2021-03-21 MED ORDER — PROMETHAZINE HCL 25 MG/ML IJ SOLN: 6.2500 mg | INTRAMUSCULAR | Status: DC | PRN

## 2021-03-21 MED ORDER — OXYCODONE HCL 5 MG PO TABS
5.0000 mg | ORAL_TABLET | Freq: Once | ORAL | Status: DC | PRN
Start: 2021-03-21 — End: 2021-03-21

## 2021-03-21 MED ORDER — ONDANSETRON HCL 4 MG/2ML IJ SOLN
INTRAMUSCULAR | Status: DC | PRN
  Administered 2021-03-21: 4 mg via INTRAVENOUS

## 2021-03-21 MED ORDER — LACTATED RINGERS IV SOLN: INTRAVENOUS | Status: DC

## 2021-03-21 MED ORDER — SCOPOLAMINE 1 MG/3DAYS TD PT72
1.0000 | MEDICATED_PATCH | TRANSDERMAL | Status: DC
  Administered 2021-03-21: 1.5 mg via TRANSDERMAL

## 2021-03-21 MED ORDER — ACETAMINOPHEN 500 MG PO TABS
1000.0000 mg | ORAL_TABLET | ORAL | Status: AC
  Administered 2021-03-21: 1000 mg via ORAL

## 2021-03-21 MED ORDER — LIDOCAINE 2% (20 MG/ML) 5 ML SYRINGE
INTRAMUSCULAR | Status: DC | PRN
  Administered 2021-03-21: 60 mg via INTRAVENOUS

## 2021-03-21 MED ORDER — FENTANYL CITRATE (PF) 100 MCG/2ML IJ SOLN: 25.0000 ug | INTRAMUSCULAR | Status: DC | PRN

## 2021-03-21 SURGICAL SUPPLY — 51 items
APL PRP STRL LF DISP 70% ISPRP (MISCELLANEOUS) ×2
APL SKNCLS STERI-STRIP NONHPOA (GAUZE/BANDAGES/DRESSINGS) ×2
APPLIER CLIP 9.375 MED OPEN (MISCELLANEOUS)
APR CLP MED 9.3 20 MLT OPN (MISCELLANEOUS)
BENZOIN TINCTURE PRP APPL 2/3 (GAUZE/BANDAGES/DRESSINGS) ×3 IMPLANT
BLADE HEX COATED 2.75 (ELECTRODE) IMPLANT
BLADE SURG 15 STRL LF DISP TIS (BLADE) ×2 IMPLANT
BLADE SURG 15 STRL SS (BLADE) ×3
CANISTER SUC SOCK COL 7IN (MISCELLANEOUS) IMPLANT
CANISTER SUCT 1200ML W/VALVE (MISCELLANEOUS) IMPLANT
CHLORAPREP W/TINT 26 (MISCELLANEOUS) ×3 IMPLANT
CLIP APPLIE 9.375 MED OPEN (MISCELLANEOUS) IMPLANT
COVER BACK TABLE 60X90IN (DRAPES) ×3 IMPLANT
COVER MAYO STAND STRL (DRAPES) ×3 IMPLANT
COVER PROBE W GEL 5X96 (DRAPES) ×3 IMPLANT
DECANTER SPIKE VIAL GLASS SM (MISCELLANEOUS) IMPLANT
DRAPE LAPAROTOMY 100X72 PEDS (DRAPES) ×3 IMPLANT
DRAPE UTILITY XL STRL (DRAPES) ×3 IMPLANT
DRSG TEGADERM 4X4.75 (GAUZE/BANDAGES/DRESSINGS) ×3 IMPLANT
ELECT REM PT RETURN 9FT ADLT (ELECTROSURGICAL) ×3
ELECTRODE REM PT RTRN 9FT ADLT (ELECTROSURGICAL) ×2 IMPLANT
GAUZE SPONGE 4X4 12PLY STRL LF (GAUZE/BANDAGES/DRESSINGS) IMPLANT
GLOVE SURG ENC MOIS LTX SZ7 (GLOVE) ×3 IMPLANT
GLOVE SURG POLYISO LF SZ7 (GLOVE) ×3 IMPLANT
GLOVE SURG UNDER POLY LF SZ7 (GLOVE) ×6 IMPLANT
GLOVE SURG UNDER POLY LF SZ7.5 (GLOVE) ×3 IMPLANT
GOWN STRL REUS W/ TWL LRG LVL3 (GOWN DISPOSABLE) ×2 IMPLANT
GOWN STRL REUS W/TWL LRG LVL3 (GOWN DISPOSABLE) ×3
GOWN STRL REUS W/TWL XL LVL3 (GOWN DISPOSABLE) ×3 IMPLANT
ILLUMINATOR WAVEGUIDE N/F (MISCELLANEOUS) IMPLANT
KIT MARKER MARGIN INK (KITS) ×3 IMPLANT
LIGHT WAVEGUIDE WIDE FLAT (MISCELLANEOUS) IMPLANT
NEEDLE HYPO 25X1 1.5 SAFETY (NEEDLE) ×3 IMPLANT
NS IRRIG 1000ML POUR BTL (IV SOLUTION) IMPLANT
PACK BASIN DAY SURGERY FS (CUSTOM PROCEDURE TRAY) ×3 IMPLANT
PENCIL SMOKE EVACUATOR (MISCELLANEOUS) ×3 IMPLANT
SLEEVE SCD COMPRESS KNEE MED (STOCKING) ×3 IMPLANT
SPONGE GAUZE 2X2 8PLY STRL LF (GAUZE/BANDAGES/DRESSINGS) ×6 IMPLANT
SPONGE T-LAP 18X18 ~~LOC~~+RFID (SPONGE) IMPLANT
SPONGE T-LAP 4X18 ~~LOC~~+RFID (SPONGE) ×3 IMPLANT
STRIP CLOSURE SKIN 1/2X4 (GAUZE/BANDAGES/DRESSINGS) ×3 IMPLANT
SUT MON AB 4-0 PC3 18 (SUTURE) ×3 IMPLANT
SUT SILK 2 0 SH (SUTURE) IMPLANT
SUT VIC AB 3-0 SH 27 (SUTURE) ×3
SUT VIC AB 3-0 SH 27X BRD (SUTURE) ×2 IMPLANT
SYR BULB EAR ULCER 3OZ GRN STR (SYRINGE) IMPLANT
SYR CONTROL 10ML LL (SYRINGE) ×3 IMPLANT
TOWEL GREEN STERILE FF (TOWEL DISPOSABLE) ×3 IMPLANT
TRAY FAXITRON CT DISP (TRAY / TRAY PROCEDURE) ×3 IMPLANT
TUBE CONNECTING 20X1/4 (TUBING) IMPLANT
YANKAUER SUCT BULB TIP NO VENT (SUCTIONS) IMPLANT

## 2021-03-21 NOTE — H&P (Signed)
Subjective    Chief Complaint: Breast Mass (Left intraductal papilloma)       History of Present Illness: April Hardin is a 45 y.o. female who is seen today as an office consultation at the request of Dr. Erin Hearing for evaluation of Breast Mass (Left intraductal papilloma) .   This is a 45 year old female in good health who presents with several years of a skin tag below her left breast.  She also recently had a routine screening mammogram that showed a possible mass in the left breast.  Further work-up showed a 1.2 x 0.4 x 0.7 mass at 11:00 in the left breast located 3 cm from the nipple.  This was biopsied and revealed intraductal papilloma with no atypia or malignancy.  The patient has a family history of breast cancer and some cousins.  She presents now to discuss excision.  She would also like to have a skin tag removed.     Review of Systems: A complete review of systems was obtained from the patient.  I have reviewed this information and discussed as appropriate with the patient.  See HPI as well for other ROS.   Review of Systems  Constitutional: Negative.   HENT: Negative.   Eyes: Negative.   Respiratory: Negative.   Cardiovascular: Negative.   Gastrointestinal: Negative.   Genitourinary: Negative.   Musculoskeletal: Negative.   Skin: Negative.   Neurological: Negative.   Endo/Heme/Allergies: Negative.   Psychiatric/Behavioral: Negative.         Medical History:     Past Medical History:  Diagnosis Date   GERD (gastroesophageal reflux disease)     Sleep apnea           Patient Active Problem List  Diagnosis   Asthma   Carpal tunnel syndrome   Perimenopausal   Seasonal allergies           Past Surgical History:  Procedure Laterality Date   HERNIA REPAIR       myoectomy          No Known Allergies   No current outpatient medications on file prior to visit.    No current facility-administered medications on file prior to visit.            Family History  Problem Relation Age of Onset   Stroke Mother     Diabetes Mother     Stomach cancer Mother        Social History       Tobacco Use  Smoking Status Never Smoker  Smokeless Tobacco Never Used      Social History        Socioeconomic History   Marital status: Single  Tobacco Use   Smoking status: Never Smoker   Smokeless tobacco: Never Used  Substance and Sexual Activity   Alcohol use: Never   Drug use: Never      Objective:         Vitals:    02/18/21 1138  BP: (!) 156/90  Pulse: 76  SpO2: 98%  Weight: 70.9 kg (156 lb 3.2 oz)  Height: 149.9 cm (4\' 11" )    Body mass index is 31.55 kg/m.   Physical Exam    Constitutional:  WDWN in NAD, conversant, no obvious deformities; lying in bed comfortably Eyes:  Pupils equal, round; sclera anicteric; moist conjunctiva; no lid lag HENT:  Oral mucosa moist; good dentition  Neck:  No masses palpated, trachea midline; no thyromegaly Lungs:  CTA bilaterally; normal respiratory effort Breasts:  symmetric, no nipple changes; no palpable masses or lymphadenopathy on either side; bilateral fibrocystic changes CV:  Regular rate and rhythm; no murmurs; extremities well-perfused with no edema Abd:  +bowel sounds, soft, non-tender, no palpable organomegaly; no palpable hernias Musc:  Unable to assess gait; no apparent clubbing or cyanosis in extremities Lymphatic:  No palpable cervical or axillary lymphadenopathy Skin:  Warm, dry; no sign of jaundice; 1 cm protruding skin tag below the left breast on the chest wall Psychiatric - alert and oriented x 4; calm mood and affect     Labs, Imaging and Diagnostic Testing: CLINICAL DATA:  Patient returns today to evaluate a possible LEFT breast mass identified on recent screening mammogram.   EXAM: DIGITAL DIAGNOSTIC UNILATERAL LEFT MAMMOGRAM WITH TOMOSYNTHESIS AND CAD; ULTRASOUND LEFT BREAST LIMITED   TECHNIQUE: Left digital diagnostic mammography and breast  tomosynthesis was performed. The images were evaluated with computer-aided detection.; Targeted ultrasound examination of the left breast was performed   COMPARISON:  Previous exam(s).   ACR Breast Density Category c: The breast tissue is heterogeneously dense, which may obscure small masses.   FINDINGS: LEFT breast diagnostic mammogram: On today's additional diagnostic views, a new oval circumscribed low-density mass is confirmed within the upper subareolar LEFT breast, measuring approximately 1.1 cm greatest dimension.   Targeted ultrasound is performed, showing an oval circumscribed hypoechoic mass in the LEFT breast at the 11 o'clock axis, 3 cm from the nipple, measuring 1.2 x 0.4 x 0.7 cm, corresponding to the mammographic finding.   LEFT axilla was evaluated with ultrasound showing no enlarged or morphologically abnormal lymph nodes.   IMPRESSION: Oval circumscribed hypoechoic mass in the LEFT breast at the 11 o'clock axis, 3 cm from the nipple, measuring 1.2 cm, corresponding to a new mammographic finding. This may represent a benign fibroadenoma. Ultrasound-guided biopsy is recommended to exclude malignancy.   RECOMMENDATION: Ultrasound-guided biopsy for the LEFT breast mass at the 11 o'clock axis.   Ultrasound-guided biopsy is scheduled for July 27th.   I have discussed the findings and recommendations with the patient. If applicable, a reminder letter will be sent to the patient regarding the next appointment.   BI-RADS CATEGORY  4: Suspicious.     Electronically Signed   By: Franki Cabot M.D.   On: 01/14/2021 15:46   Assessment and Plan:  Diagnoses and all orders for this visit:   Intraductal papilloma of breast, left   Skin tag Comments: Left chest wall 1 cm       Left seed localized lumpectomy/ excision of left chest wall skin tag.The surgical procedure has been discussed with the patient.  Potential risks, benefits, alternative treatments, and  expected outcomes have been explained.  All of the patient's questions at this time have been answered.  The likelihood of reaching the patient's treatment goal is good.  The patient understand the proposed surgical procedure and wishes to proceed.      April Hardin. April Dover, MD, [facility]  General Surgery   03/21/2021 7:27 AM

## 2021-03-21 NOTE — Transfer of Care (Signed)
Immediate Anesthesia Transfer of Care Note  Patient: Shantil Vallejo  Procedure(s) Performed: LEFT BREAST LUMPECTOMY WITH RADIOACTIVE SEED LOCALIZATION (Left: Breast) EXCISION OF SKIN TAG LEFT CHEST WALL (Left: Chest)  Patient Location: PACU  Anesthesia Type:General  Level of Consciousness: awake  Airway & Oxygen Therapy: Patient Spontanous Breathing and Patient connected to face mask oxygen  Post-op Assessment: Report given to RN, Post -op Vital signs reviewed and stable and Patient moving all extremities X 4  Post vital signs: Reviewed and stable  Last Vitals:  Vitals Value Taken Time  BP 156/88   Temp    Pulse 74 03/21/21 0939  Resp 14 03/21/21 0939  SpO2 100 % 03/21/21 0939  Vitals shown include unvalidated device data.  Last Pain:  Vitals:   03/21/21 0714  TempSrc: Oral  PainSc: 0-No pain         Complications: No notable events documented.

## 2021-03-21 NOTE — Op Note (Signed)
Pre-op Diagnosis:  Left breast intraductal papilloma/ left chest wall skin tag Post-op Diagnosis: same Procedure:  Left radioactive seed localized lumpectomy/ excision of left chest wall skin tag Surgeon:  Abdifatah Colquhoun K. Anesthesia:  GEN - LMA Indications:  This is a 45 year old female in good health who presents with several years of a skin tag below her left breast.  She also recently had a routine screening mammogram that showed a possible mass in the left breast.  Further work-up showed a 1.2 x 0.4 x 0.7 mass at 11:00 in the left breast located 3 cm from the nipple.  This was biopsied and revealed intraductal papilloma with no atypia or malignancy.  The patient has a family history of breast cancer and some cousins.  She presents now to discuss excision.  She would also like to have a skin tag removed.  Description of procedure: The patient is brought to the operating room placed in supine position on the operating room table. After an adequate level of general anesthesia was obtained, her Left breast and chest were prepped with ChloraPrep and draped in sterile fashion. A timeout was taken to ensure the proper patient and proper procedure. We interrogated the breast with the neoprobe. We made a transverse incision above the nipple after infiltrating with 0.25% Marcaine. Dissection was carried down in the breast tissue with cautery. We used the neoprobe to guide [place] towards the radioactive seed. We excised an area of tissue around the radioactive seed 2 cm in diameter. The specimen was removed and was oriented with a paint kit. Specimen mammogram showed the radioactive seed as well as the biopsy clip within the specimen. This was sent for pathologic examination. There is no residual radioactivity within the biopsy cavity. We inspected carefully for hemostasis. The wound was thoroughly irrigated. The wound was closed with a deep layer of 3-0 Vicryl and a subcuticular layer of 4-0 Monocryl. I then excised  the skin tag below the breast on the left.  This was sent of pathologic examination.  That wound was also closed with 4-0 Monocryl.  Benzoin Steri-Strips were applied. The patient was then extubated and brought to the recovery room in stable condition. All sponge, instrument, and needle counts are correct.  [name]. Georgette Dover, MD, [facility]  General/ Trauma Surgery  03/21/2021 9:45 AM

## 2021-03-21 NOTE — Anesthesia Postprocedure Evaluation (Signed)
Anesthesia Post Note  Patient: April Hardin  Procedure(s) Performed: LEFT BREAST LUMPECTOMY WITH RADIOACTIVE SEED LOCALIZATION (Left: Breast) EXCISION OF SKIN TAG LEFT CHEST WALL (Left: Chest)     Patient location during evaluation: PACU Anesthesia Type: General Level of consciousness: awake and alert Pain management: pain level controlled Vital Signs Assessment: post-procedure vital signs reviewed and stable Respiratory status: spontaneous breathing, nonlabored ventilation and respiratory function stable Cardiovascular status: blood pressure returned to baseline and stable Postop Assessment: no apparent nausea or vomiting Anesthetic complications: no   No notable events documented.  Last Vitals:  Vitals:   03/21/21 1015 03/21/21 1126  BP: (!) 142/89 (!) 157/88  Pulse: 99 80  Resp: 15 16  Temp:  36.7 C  SpO2: 94% 99%    Last Pain:  Vitals:   03/21/21 1126  TempSrc:   PainSc: 0-No pain                 Lynda Rainwater

## 2021-03-21 NOTE — Anesthesia Preprocedure Evaluation (Addendum)
Anesthesia Evaluation  Patient identified by MRN, date of birth, ID band Patient awake    Reviewed: Allergy & Precautions, NPO status , Patient's Chart, lab work & pertinent test results  History of Anesthesia Complications Negative for: history of anesthetic complications  Airway Mallampati: I  TM Distance: >3 FB Neck ROM: Full    Dental  (+) Dental Advisory Given   Pulmonary sleep apnea (mild, does not use CPAP) , COPD (has not needed inhaler in several months),  COPD inhaler,    breath sounds clear to auscultation       Cardiovascular negative cardio ROS   Rhythm:Regular Rate:Normal     Neuro/Psych negative neurological ROS     GI/Hepatic Neg liver ROS, GERD  Controlled,  Endo/Other  Morbid obesity  Renal/GU negative Renal ROS     Musculoskeletal   Abdominal (+) + obese,   Peds  Hematology negative hematology ROS (+)   Anesthesia Other Findings   Reproductive/Obstetrics Post-menopausal                            Anesthesia Physical Anesthesia Plan  ASA: 2  Anesthesia Plan: General   Post-op Pain Management:    Induction: Intravenous  PONV Risk Score and Plan: 3 and Ondansetron, Dexamethasone and Scopolamine patch - Pre-op  Airway Management Planned: LMA  Additional Equipment: None  Intra-op Plan:   Post-operative Plan:   Informed Consent: I have reviewed the patients History and Physical, chart, labs and discussed the procedure including the risks, benefits and alternatives for the proposed anesthesia with the patient or authorized representative who has indicated his/her understanding and acceptance.     Dental advisory given  Plan Discussed with: CRNA and Surgeon  Anesthesia Plan Comments:        Anesthesia Quick Evaluation

## 2021-03-21 NOTE — Discharge Instructions (Addendum)
[place] Office Phone Number [phone]  BREAST BIOPSY/ PARTIAL MASTECTOMY: POST OP INSTRUCTIONS  Always review your discharge instruction sheet given to you by the facility where your surgery was performed.  IF YOU HAVE DISABILITY OR FAMILY LEAVE FORMS, YOU MUST BRING THEM TO THE OFFICE FOR PROCESSING.  DO NOT GIVE THEM TO YOUR DOCTOR.  A prescription for pain medication may be given to you upon discharge.  Take your pain medication as prescribed, if needed.  If narcotic pain medicine is not needed, then you may take acetaminophen (Tylenol) or ibuprofen (Advil) as needed. Take your usually prescribed medications unless otherwise directed If you need a refill on your pain medication, please contact your pharmacy.  They will contact our office to request authorization.  Prescriptions will not be filled after 5pm or on week-ends. You should eat very light the first 24 hours after surgery, such as soup, crackers, pudding, etc.  Resume your normal diet the day after surgery. Most patients will experience some swelling and bruising in the breast.  Ice packs and a good support bra will help.  Swelling and bruising can take several days to resolve.  It is common to experience some constipation if taking pain medication after surgery.  Increasing fluid intake and taking a stool softener will usually help or prevent this problem from occurring.  A mild laxative (Milk of Magnesia or Miralax) should be taken according to package directions if there are no bowel movements after 48 hours. Unless discharge instructions indicate otherwise, you may remove your bandages 24-48 hours after surgery, and you may shower at that time.  You may have steri-strips (small skin tapes) in place directly over the incision.  These strips should be left on the skin for 7-10 days.  If your surgeon used skin glue on the incision, you may shower in 24 hours.  The glue will flake off over the next 2-3 weeks.  Any  sutures or staples will be removed at the office during your follow-up visit. ACTIVITIES:  You may resume regular daily activities (gradually increasing) beginning the next day.  Wearing a good support bra or sports bra minimizes pain and swelling.  You may have sexual intercourse when it is comfortable. You may drive when you no longer are taking prescription pain medication, you can comfortably wear a seatbelt, and you can safely maneuver your car and apply brakes. RETURN TO WORK:  ______________________________________________________________________________________ Dennis Bast should see your doctor in the office for a follow-up appointment approximately two weeks after your surgery.  Your doctor's nurse will typically make your follow-up appointment when she calls you with your pathology report.  Expect your pathology report 2-3 business days after your surgery.  You may call to check if you do not hear from [place] after three days. OTHER INSTRUCTIONS: _______________________________________________________________________________________________ _____________________________________________________________________________________________________________________________________ _____________________________________________________________________________________________________________________________________ _____________________________________________________________________________________________________________________________________  WHEN TO CALL YOUR DOCTOR: Fever over 101.0 Nausea and/or vomiting. Extreme swelling or bruising. Continued bleeding from incision. Increased pain, redness, or drainage from the incision.  The clinic staff is available to answer your questions during regular business hours.  Please don't hesitate to call and ask to speak to one of the nurses for clinical concerns.  If you have a medical emergency, go to the nearest emergency room or call 911.  A surgeon from [facility] is always on call at the hospital.  For further questions, please visit [email or web address]   Post Anesthesia Home Care Instructions  Activity: Get plenty of rest for the remainder of the  day. A responsible individual must stay with you for 24 hours following the procedure.  For the next 24 hours, DO NOT: -Drive a car -Paediatric nurse -Drink alcoholic beverages -Take any medication unless instructed by your physician -Make any legal decisions or sign important papers.  Meals: Start with liquid foods such as gelatin or soup. Progress to regular foods as tolerated. Avoid greasy, spicy, heavy foods. If nausea and/or vomiting occur, drink only clear liquids until the nausea and/or vomiting subsides. Call your physician if vomiting continues.  Special Instructions/Symptoms: Your throat may feel dry or sore from the anesthesia or the breathing tube placed in your throat during surgery. If this causes discomfort, gargle with warm salt water. The discomfort should disappear within 24 hours.  If you had a scopolamine patch placed behind your ear for the management of post- operative nausea and/or vomiting:  1. The medication in the patch is effective for 72 hours, after which it should be removed.  Wrap patch in a tissue and discard in the trash. Wash hands thoroughly with soap and water. 2. You may remove the patch earlier than 72 hours if you experience unpleasant side effects which may include dry mouth, dizziness or visual disturbances. 3. Avoid touching the patch. Wash your hands with soap and water after contact with the patch.       nEXT DOSE OF TYLENOL MAY BE GIVEN AT 1;30

## 2021-03-21 NOTE — Anesthesia Procedure Notes (Signed)
Procedure Name: LMA Insertion Date/Time: 03/21/2021 8:44 AM Performed by: Niel Hummer, CRNA Pre-anesthesia Checklist: Patient identified, Emergency Drugs available, Suction available and Patient being monitored Patient Re-evaluated:Patient Re-evaluated prior to induction Preoxygenation: Pre-oxygenation with 100% oxygen Induction Type: IV induction Ventilation: Mask ventilation without difficulty LMA: LMA inserted LMA Size: 4.0 Number of attempts: 1 Dental Injury: Teeth and Oropharynx as per pre-operative assessment

## 2021-03-22 LAB — SURGICAL PATHOLOGY

## 2021-03-26 ENCOUNTER — Ambulatory Visit: Payer: Medicaid Other | Admitting: Family Medicine

## 2021-03-26 ENCOUNTER — Encounter (HOSPITAL_BASED_OUTPATIENT_CLINIC_OR_DEPARTMENT_OTHER): Payer: Self-pay | Admitting: Surgery

## 2021-03-26 ENCOUNTER — Other Ambulatory Visit: Payer: Self-pay

## 2021-03-26 DIAGNOSIS — K649 Unspecified hemorrhoids: Secondary | ICD-10-CM

## 2021-03-26 MED ORDER — PHENYLEPHRINE-MINERAL OIL-PET 0.25-14-74.9 % RE OINT: 1.0000 "application " | TOPICAL_OINTMENT | Freq: Two times a day (BID) | RECTAL | 3 refills | Status: AC | PRN

## 2021-03-26 NOTE — Patient Instructions (Addendum)
It was great seeing you today.  Your issue is that you have a hemorrhoid.  Is called an external hemorrhoid.  Need to decrease your time sitting on the toilet and make sure your bowel movements are nice and soft.  If you start having issues with constipation please take MiraLAX.  I will send some Preparation H cream to your pharmacy.  If you have any questions or concerns please call the clinic.  I hope you have a wonderful afternoon!

## 2021-03-26 NOTE — Progress Notes (Signed)
    SUBJECTIVE:   CHIEF COMPLAINT / HPI:   Hemorrhoid Patient reports that she feels a lump in her rectum especially after having bowel movements.  She denies any issues with constipation.  She reports that she will sit on the toilet for 45 minutes on her phone and or playing games.  She notices that the lesion gets bigger when this occurs.  Reports she saw blood on her toilet paper last week but no profuse bleeding.  She also has concerns of itching around the lesion.  She had her daughter take a picture of it and she shows it to me during the visit.   OBJECTIVE:   BP 138/72   Pulse 78   Wt 157 lb 12.8 oz (71.6 kg)   LMP  (LMP Unknown)   SpO2 98%   BMI 31.87 kg/m   General: Well-appearing 45 year old female, no acute distress Respiratory: Normal work of breathing, speaking full sentences Rectal exam: Deferred at this time, patient does have image of the lesion and is consistent with hemorrhoid.  ASSESSMENT/PLAN:   Hemorrhoid Patient with symptoms consistent with hemorrhoid.  Photo of patient's rectum consistent with hemorrhoid as well.  Discussed conservative measures such as stool softeners, decreasing the amount of time she is sitting on the toilet, sitz bath's.  Sent prescription for Preparation H to patient's pharmacy.  Strict return precautions given.  No further questions or concerns.     Gifford Shave, MD [place]

## 2021-03-27 DIAGNOSIS — K649 Unspecified hemorrhoids: Secondary | ICD-10-CM | POA: Insufficient documentation

## 2021-03-27 NOTE — Assessment & Plan Note (Signed)
Patient with symptoms consistent with hemorrhoid.  Photo of patient's rectum consistent with hemorrhoid as well.  Discussed conservative measures such as stool softeners, decreasing the amount of time she is sitting on the toilet, sitz bath's.  Sent prescription for Preparation H to patient's pharmacy.  Strict return precautions given.  No further questions or concerns.

## 2021-07-17 IMAGING — MG MM DIGITAL SCREENING BILAT W/ TOMO AND CAD
8 series · 8 of 24 positions shown · non-contrast
Comparison: Previous exam(s).

CLINICAL DATA: Screening.

EXAM:
DIGITAL SCREENING BILATERAL MAMMOGRAM WITH TOMOSYNTHESIS AND CAD
TECHNIQUE: Bilateral screening digital craniocaudal and mediolateral oblique
mammograms were obtained. Bilateral screening digital breast
tomosynthesis was performed. The images were evaluated with
computer-aided detection.

[L MLO synth-2D]
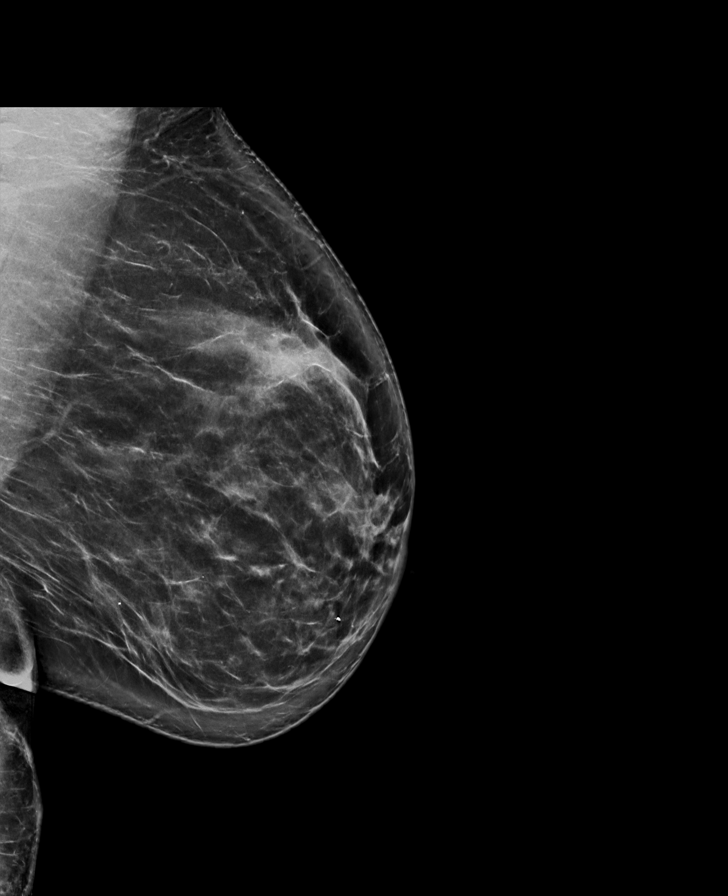

[R MLO synth-2D]
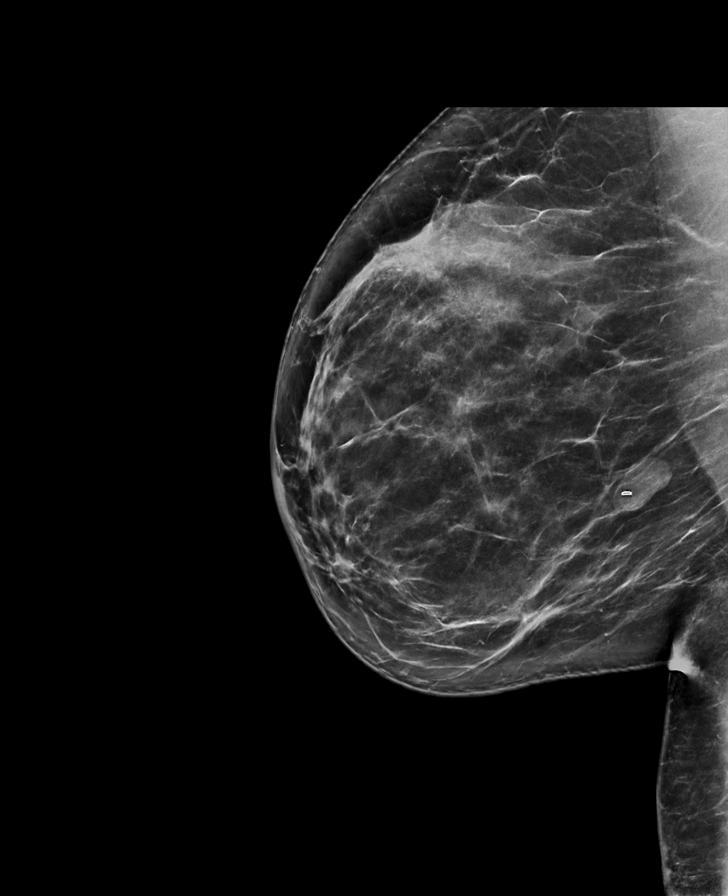

[L CC synth-2D]
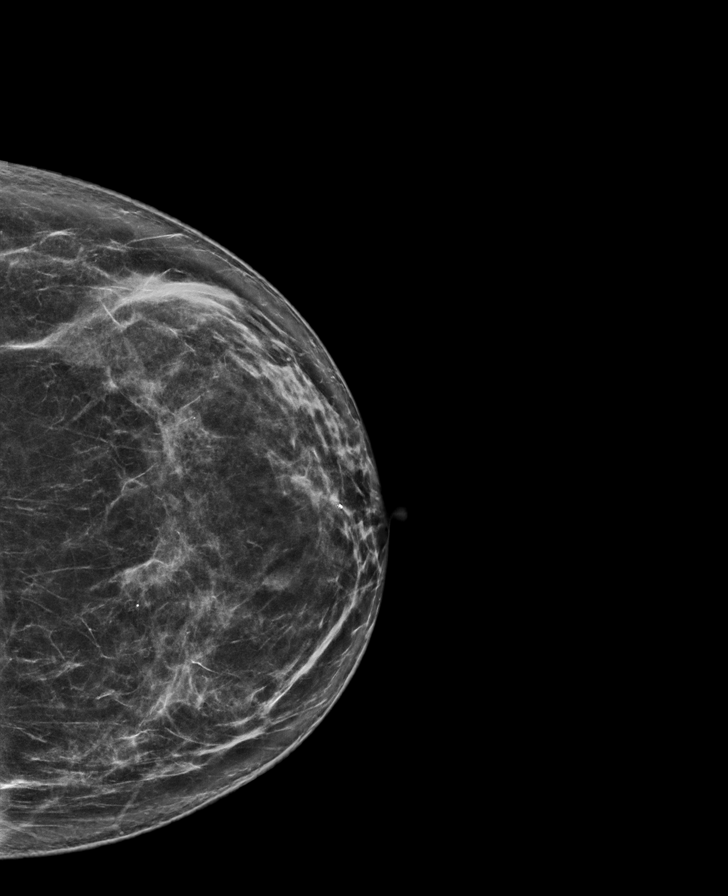

[R CC synth-2D]
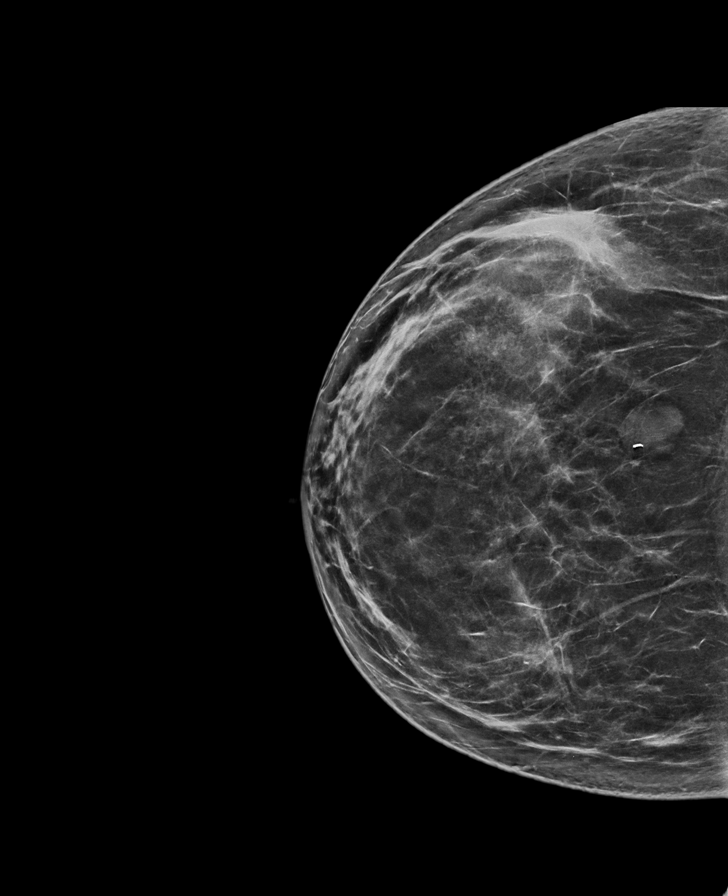

[L CC tomo · tomo slice 43/86.0]
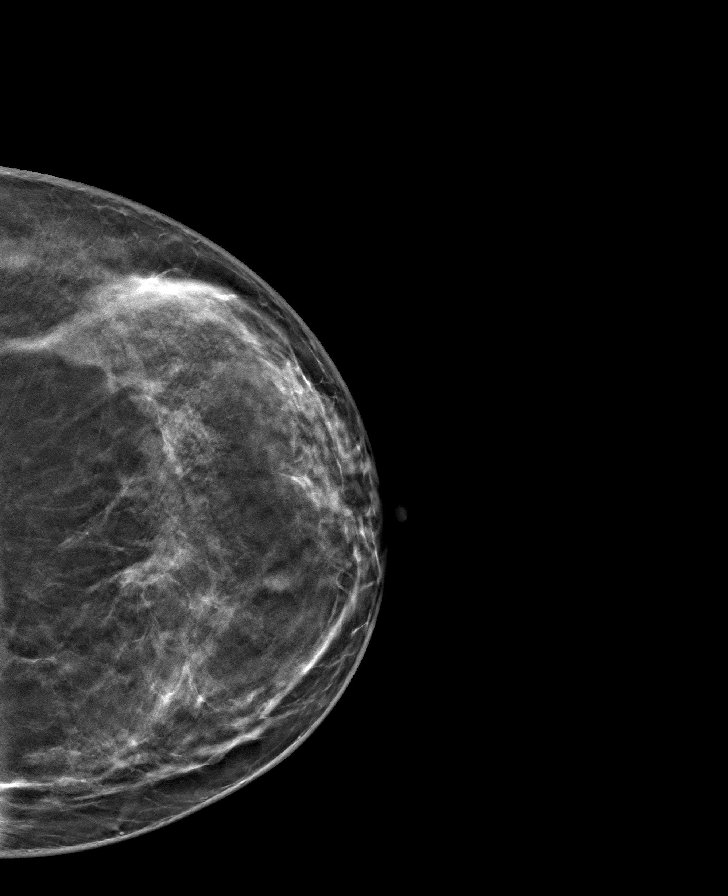

[L MLO tomo · tomo slice 50/99.0]
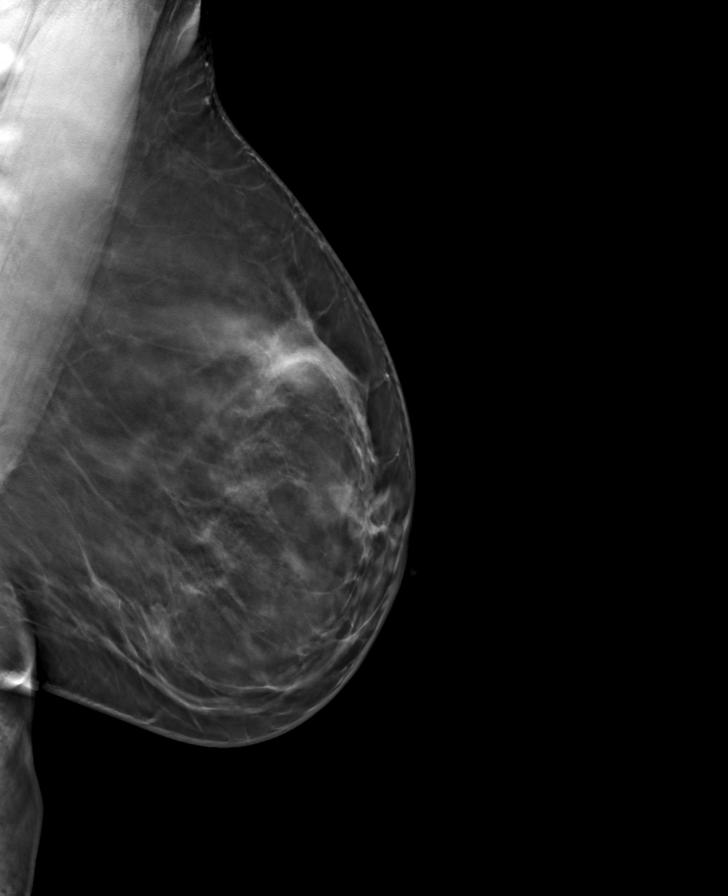

[R CC tomo · tomo slice 45/89.0]
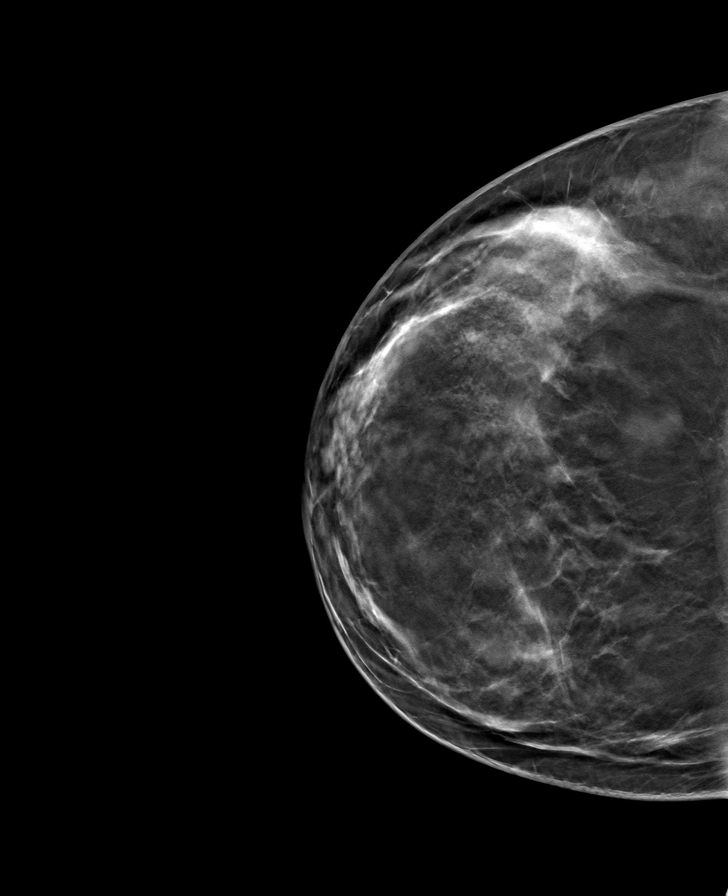

[R MLO tomo · tomo slice 49/96.0]
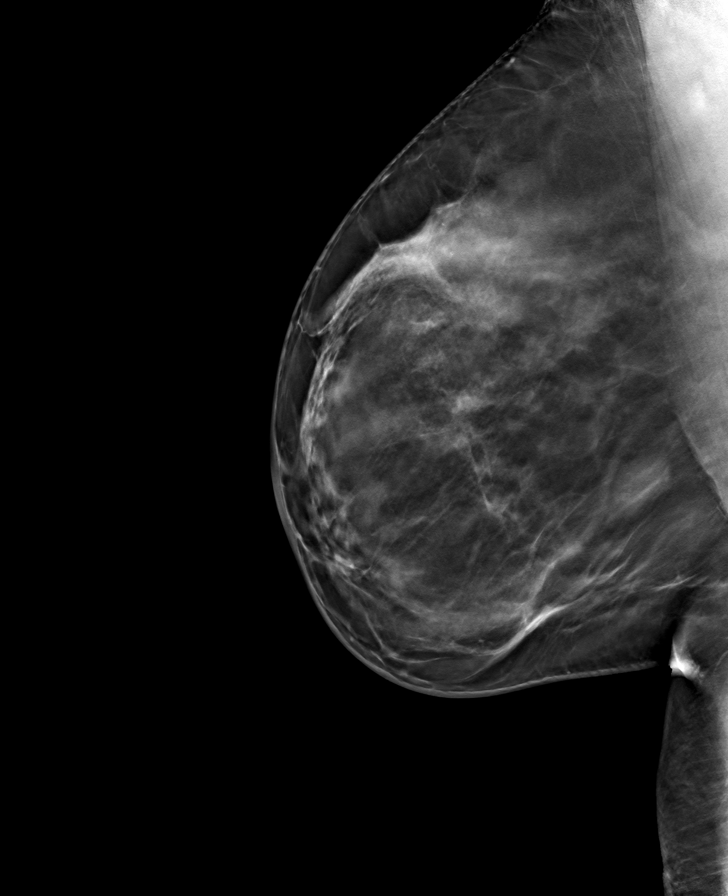

[8 of 24 positions shown; findings below may reference images not displayed]

ACR Breast Density Category c: The breast tissue is heterogeneously
dense, which may obscure small masses.
FINDINGS: In the left breast, a possible mass in the anterior upper inner
breast warrants further evaluation. In the right breast, no findings
suspicious for malignancy.
IMPRESSION: Further evaluation is suggested for a possible mass in the left
breast.

RECOMMENDATION:
Diagnostic mammogram and possibly ultrasound of the left breast.
(Code:[id number])

The patient will be contacted regarding the findings, and additional
imaging will be scheduled.

BI-RADS CATEGORY  0: Incomplete. Need additional imaging evaluation
and/or prior mammograms for comparison.

## 2021-10-16 ENCOUNTER — Encounter (HOSPITAL_COMMUNITY): Payer: Self-pay

## 2022-05-28 ENCOUNTER — Telehealth: Payer: Self-pay
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
## 2022-05-29 ENCOUNTER — Telehealth: Payer: Self-pay

## 2022-05-29 ENCOUNTER — Ambulatory Visit: Payer: Medicaid Other | Admitting: Student

## 2022-05-29 VITALS — BP 138/76 | HR 90 | Wt 163.4 lb

## 2022-05-29 DIAGNOSIS — S058X2A Other injuries of left eye and orbit, initial encounter: Secondary | ICD-10-CM | POA: Diagnosis not present

## 2022-05-29 DIAGNOSIS — H538 Other visual disturbances: Secondary | ICD-10-CM

## 2022-05-29 DIAGNOSIS — S0590XA Unspecified injury of unspecified eye and orbit, initial encounter: Secondary | ICD-10-CM

## 2022-05-29 DIAGNOSIS — Z1231 Encounter for screening mammogram for malignant neoplasm of breast: Secondary | ICD-10-CM | POA: Diagnosis not present

## 2022-05-29 DIAGNOSIS — S0592XA Unspecified injury of left eye and orbit, initial encounter: Secondary | ICD-10-CM | POA: Diagnosis not present

## 2022-05-29 DIAGNOSIS — R030 Elevated blood-pressure reading, without diagnosis of hypertension: Secondary | ICD-10-CM | POA: Diagnosis present

## 2022-05-29 NOTE — Telephone Encounter (Signed)
Called patient to provide with appointment details for CT scan. She did not answer, VM was full.   CT scheduled at [facility] location for tomorrow morning, 05/30/22. Check in at 7:45.   If patient calls back, please provide with appointment information.  If she is unable to come at this time, scheduler recommended that patient call [phone] to reschedule.   Talbot Grumbling, RN
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
## 2022-05-29 NOTE — Patient Instructions (Addendum)
Great meeting you today. Your blood pressure is elevated today.  Check your blood pressure at home twice daily for 7 days. Keep a log of these numbers and bring them in for your nurse visit with your cuff.  In the meantime, if you get chest pain, shortness of breath, floaters/changes in vision and your blood pressure is high- call us or go the urgent care.  We are getting labs today. If they are abnormal, I will call you.  We are also getting a CT scan of your eyes and the bones around them. I placed a referral to ophthalmology- you will get a call to get an appointment scheduled.  Please schedule a nurse visit at the front desk in 1 week for blood pressure check.  Dr. Owens Shark
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
## 2022-05-29 NOTE — Assessment & Plan Note (Signed)
BP elevated to 146/82 initially, with repeat 15 minutes later 138/76.  Still not within goal.  Per chart review, she has had elevated blood pressure readings for years. Discussed importance of blood pressure reduction with patient.  She is hesitant to start medication. We decided she will check her blood pressure at home twice daily and bring this log in addition in addition to her cuff for nurse visit next week. If elevation greater than 130/80, will plan to initiate Losartan 50  mg daily. Obtained CMP, CBC, lipid panel, A1c

## 2022-05-30 ENCOUNTER — Ambulatory Visit (HOSPITAL_BASED_OUTPATIENT_CLINIC_OR_DEPARTMENT_OTHER): Payer: Medicaid Other | Attending: Family Medicine

## 2022-05-30 LAB — CBC
Hematocrit: 41.5 % (ref 34.0–46.6)
Hemoglobin: 13.3 g/dL (ref 11.1–15.9)
MCH: 28.4 pg (ref 26.6–33.0)
MCHC: 32 g/dL (ref 31.5–35.7)
MCV: 89 fL (ref 79–97)
Platelets: 176 10*3/uL (ref 150–450)
RBC: 4.69 x10E6/uL (ref 3.77–5.28)
RDW: 12.9 % (ref 11.7–15.4)
WBC: 4 10*3/uL (ref 3.4–10.8)

## 2022-05-30 LAB — COMPREHENSIVE METABOLIC PANEL
ALT: 21 IU/L (ref 0–32)
AST: 24 IU/L (ref 0–40)
Albumin/Globulin Ratio: 1.4 (ref 1.2–2.2)
Albumin: 4.4 g/dL (ref 3.9–4.9)
Alkaline Phosphatase: 58 IU/L (ref 44–121)
BUN/Creatinine Ratio: 12 (ref 9–23)
BUN: 11 mg/dL (ref 6–24)
Bilirubin Total: 0.3 mg/dL (ref 0.0–1.2)
CO2: 23 mmol/L (ref 20–29)
Calcium: 9.1 mg/dL (ref 8.7–10.2)
Chloride: 102 mmol/L (ref 96–106)
Creatinine, Ser: 0.91 mg/dL (ref 0.57–1.00)
Globulin, Total: 3.2 g/dL (ref 1.5–4.5)
Glucose: 160 mg/dL — ABNORMAL HIGH (ref 70–99)
Potassium: 3.8 mmol/L (ref 3.5–5.2)
Sodium: 140 mmol/L (ref 134–144)
Total Protein: 7.6 g/dL (ref 6.0–8.5)
eGFR: 78 mL/min/{1.73_m2} (ref >59)

## 2022-05-30 LAB — LIPID PANEL
Chol/HDL Ratio: 4.6 ratio — ABNORMAL HIGH (ref 0.0–4.4)
Cholesterol, Total: 287 mg/dL — ABNORMAL HIGH (ref 100–199)
HDL: 63 mg/dL (ref >39)
LDL Chol Calc (NIH): 197 mg/dL — ABNORMAL HIGH (ref 0–99)
Triglycerides: 150 mg/dL — ABNORMAL HIGH (ref 0–149)
VLDL Cholesterol Cal: 27 mg/dL (ref 5–40)

## 2022-05-30 LAB — HEMOGLOBIN A1C
Est. average glucose Bld gHb Est-mCnc: 128 mg/dL
Hgb A1c MFr Bld: 6.1 % — ABNORMAL HIGH (ref 4.8–5.6)

## 2022-06-04 ENCOUNTER — Ambulatory Visit: Payer: Medicaid Other

## 2022-06-04 VITALS — BP 136/74 | HR 94

## 2022-06-04 DIAGNOSIS — Z0130 Encounter for examination of blood pressure without abnormal findings: Secondary | ICD-10-CM

## 2022-06-04 NOTE — Progress Notes (Signed)
Patient here today for BP check.      Last BP was on 138/76 and was 05/29/2022..  BP today is 136/74 with a pulse of 94.    Checked BP in left arm with large cuff.    Symptoms present: None.   Patient brings home BP log. See below.   12/1: 151/85 12/2: 163/94  12/3: 164/84 12/4: 155/90  Patient remains uninterested in starting a blood pressure medication at this time.   Routed note to PCP.

## 2022-06-25 ENCOUNTER — Encounter (HOSPITAL_BASED_OUTPATIENT_CLINIC_OR_DEPARTMENT_OTHER): Payer: Self-pay

## 2022-06-25 ENCOUNTER — Ambulatory Visit (HOSPITAL_BASED_OUTPATIENT_CLINIC_OR_DEPARTMENT_OTHER)
Admission: RE | Admit: 2022-06-25 | Discharge: 2022-06-25 | Disposition: A | Discharge status: Home / Self Care | Payer: Medicaid Other | Source: Ambulatory Visit | Attending: Family Medicine | Admitting: Family Medicine

## 2022-06-25 ENCOUNTER — Ambulatory Visit (HOSPITAL_BASED_OUTPATIENT_CLINIC_OR_DEPARTMENT_OTHER): Admission: RE | Admit: 2022-06-25 | Payer: Medicaid Other | Source: Ambulatory Visit

## 2022-06-25 DIAGNOSIS — S0590XA Unspecified injury of unspecified eye and orbit, initial encounter: Secondary | ICD-10-CM | POA: Insufficient documentation

## 2022-08-14 ENCOUNTER — Other Ambulatory Visit: Payer: Self-pay | Admitting: Surgery

## 2022-08-14 DIAGNOSIS — N644 Mastodynia: Secondary | ICD-10-CM

## 2022-09-26 ENCOUNTER — Ambulatory Visit
Admission: RE | Admit: 2022-09-26 | Discharge: 2022-09-26 | Disposition: A | Discharge status: Home / Self Care | Payer: Medicaid Other | Source: Ambulatory Visit | Attending: Surgery | Admitting: Surgery

## 2022-09-26 DIAGNOSIS — N644 Mastodynia: Secondary | ICD-10-CM

## 2023-08-18 ENCOUNTER — Ambulatory Visit: Payer: Medicaid Other | Admitting: Student

## 2023-08-18 VITALS — BP 128/74 | HR 98 | Wt 159.0 lb

## 2023-08-18 DIAGNOSIS — H579 Unspecified disorder of eye and adnexa: Secondary | ICD-10-CM

## 2023-08-18 NOTE — Progress Notes (Unsigned)
    SUBJECTIVE:   CHIEF COMPLAINT / HPI:   Pressure of right eye Since this morning, feels pressure in R eye/R side of head which feels like tension headache, discomfort when she looks to the R. No changes in vision or drainage. Denies any trauma to R eye. Admits to lack of sleep recently due thinking about her day and to-do list and states this may be related No SOB, weakness, chest pain currently  Notes last week the medial R eye was very red (not painful) and she was told at [facility] it was subconjunctival hemorrhage.  This has resolved. Does have glasses, has not seen a doctor since 2019.   PERTINENT  PMH / PSH: None pertinent  OBJECTIVE:   BP 128/74   Pulse 98   Wt 159 lb (72.1 kg)   LMP  (LMP Unknown)   SpO2 94%   BMI 32.11 kg/m    General: NAD, pleasant, able to participate in exam HEENT: White sclera, clear conjunctiva, no drainage from eyes, no lid edema. PERRLA bilaterally. EOEMI, notes mild discomfort of R eye when looking to the right Cardiac: RRR, no murmurs. Respiratory: CTAB, normal effort, No wheezes, rales or rhonchi Skin: warm and dry Neuro: alert, cranial nerves II through XII intact, sensation intact, finger-nose-finger test normal Psych: Normal affect and mood  Vision: 20/20 in both eyes with corrective lenses on  ASSESSMENT/PLAN:   Eye pressure Agree with patient that this could be right sided tension headache causing discomfort.  Low concern for acute angle-closure glaucoma, keratitis, conjunctivitis, retinal artery detachment given no erythema or changes in vision.  Low concern for orbital cellulitis given no lid edema, drainage, and discomfort with lateral eye movement is mild.  Can consider open-angle glaucoma though less likely given no vision changes.  Will still benefit from referral to ophthalmology in case symptoms worsen and she has not seen ophthalmologist since 2019. -Urgent referral to ophthalmology -Discussed ED precautions -Discussed good sleep  hygiene which may help if this is related to tension type headache, can try OTC ibuprofen as needed   Patient advised to schedule appointment with PCP in near future to discuss anxiety/sleep issues   Dr. Erick Alley, DO [facility]

## 2023-08-18 NOTE — Patient Instructions (Addendum)
 It was great to see you! Thank you for allowing me to participate in your care!  I recommend that you always bring your medications to each appointment as this makes it easy to ensure you are on the correct medications and helps [place] not miss when refills are needed.  Our plans for today:  - referral placed to ophthalmology, you will be called to schedule - For now, you can take over the counter ibuprofen 400 to 600 mg every 6 hours as needed - try to practice good sleep hygiene and return for a visit with your PCP in near future    Take care and seek immediate care sooner if you develop any concerns.   Dr. Erick Alley, DO [facility]

## 2023-08-19 DIAGNOSIS — H579 Unspecified disorder of eye and adnexa: Secondary | ICD-10-CM | POA: Insufficient documentation

## 2023-08-19 NOTE — Assessment & Plan Note (Signed)
 Agree with patient that this could be right sided tension headache causing discomfort.  Low concern for acute angle-closure glaucoma, keratitis, conjunctivitis, retinal artery detachment given no erythema or changes in vision.  Low concern for orbital cellulitis given no lid edema, drainage, and discomfort with lateral eye movement is mild.  Can consider open-angle glaucoma though less likely given no vision changes.  Will still benefit from referral to ophthalmology in case symptoms worsen and she has not seen ophthalmologist since 2019. -Urgent referral to ophthalmology -Discussed ED precautions -Discussed good sleep hygiene which may help if this is related to tension type headache, can try OTC ibuprofen as needed

## 2023-08-28 ENCOUNTER — Ambulatory Visit (INDEPENDENT_AMBULATORY_CARE_PROVIDER_SITE_OTHER): Payer: Medicaid Other | Admitting: Student

## 2023-08-28 VITALS — BP 156/79 | HR 95 | Ht 59.0 in | Wt 161.4 lb

## 2023-08-28 DIAGNOSIS — R030 Elevated blood-pressure reading, without diagnosis of hypertension: Secondary | ICD-10-CM | POA: Diagnosis not present

## 2023-08-28 DIAGNOSIS — H579 Unspecified disorder of eye and adnexa: Secondary | ICD-10-CM

## 2023-08-28 NOTE — Patient Instructions (Addendum)
 Pleasure to meet you today.  Your blood pressure was slightly elevated.  Please check your blood pressure daily and keep a record to be discussed at your next appointment for your physical.  Glad to hear that your eye pain has improved.  Below is the information for the eye doctor.  Please make sure to call them and schedule an appointment to be seen soon.  [facility] [address] [place], [place] [address] [phone]   Schedule an appointment to be seen for your annual physical.

## 2023-08-28 NOTE — Assessment & Plan Note (Addendum)
 BP elevated x 2 today.  Chart review patient has historically had elevated BP.  Suspect underlying hypertension however patient is reluctant about starting BP medication.  Advised patient to keep a blood pressure log daily to be reviewed at her next appointment for physical.  Encourage patient on exercising and dieting to improve blood pressure.

## 2023-08-28 NOTE — Progress Notes (Signed)
    SUBJECTIVE:   CHIEF COMPLAINT / HPI:   48 year old female presenting for 2 weeks follow up for right eye pain. At the time described as tension headache with discomfort on right lateral eye movement. No trauma or drainge. Also reported redness of the eye described as subconjunctival hemorrage that resolved a week before the onset of the eye pain. Today patient said right eye pain has resolved but still endorses frontal headache occasionally which respond well to Tylenol.  Has not seen ophthalmology yet still expecting call from the eye doctor.  PERTINENT  PMH / PSH: Reviewed  OBJECTIVE:   BP (!) 156/79   Pulse 95   Ht 4\' 11"  (1.499 m)   Wt 161 lb 6.4 oz (73.2 kg)   LMP  (LMP Unknown)   SpO2 95%   BMI 32.60 kg/m    Physical Exam General: Alert, well appearing, NAD Eye: Pupils equal and reactive to light, normal vision test and no notable eye edema or conjunctival abnormalities. Cardiovascular: RRR, No Murmurs, Normal S2/S2 Respiratory: CTAB, No wheezing or Rales  ASSESSMENT/PLAN:   Eye pressure Currently improved from last time.  Currently describing more frontal pressure in the forehead likely secondary to tension type headaches given good response to Tylenol.  Visual exam today generally unremarkable. -Continue Tylenol for headache -Provided patient with contact for ophthalmologist -ED return precaution discussed with patient.  Elevated blood pressure reading BP elevated x 2 today.  Chart review patient has historically had elevated BP.  Suspect underlying hypertension however patient is reluctant about starting BP medication.  Advised patient to keep a blood pressure log daily to be reviewed at her next appointment for physical.  Encourage patient on exercising and dieting to improve blood pressure.     Jerre Simon, MD [facility]

## 2023-08-28 NOTE — Assessment & Plan Note (Signed)
 Currently improved from last time.  Currently describing more frontal pressure in the forehead likely secondary to tension type headaches given good response to Tylenol.  Visual exam today generally unremarkable. -Continue Tylenol for headache -Provided patient with contact for ophthalmologist -ED return precaution discussed with patient.

## 2023-09-22 ENCOUNTER — Encounter: Payer: Self-pay | Admitting: Student

## 2023-09-22 ENCOUNTER — Ambulatory Visit (INDEPENDENT_AMBULATORY_CARE_PROVIDER_SITE_OTHER): Payer: Self-pay | Admitting: Student

## 2023-09-22 VITALS — BP 125/80 | HR 80 | Temp 98.2°F | Wt 160.4 lb

## 2023-09-22 DIAGNOSIS — N951 Menopausal and female climacteric states: Secondary | ICD-10-CM

## 2023-09-22 DIAGNOSIS — Z0000 Encounter for general adult medical examination without abnormal findings: Secondary | ICD-10-CM

## 2023-09-22 DIAGNOSIS — R7303 Prediabetes: Secondary | ICD-10-CM

## 2023-09-22 LAB — POCT GLYCOSYLATED HEMOGLOBIN (HGB A1C): HbA1c, POC (prediabetic range): 6.1 % (ref 5.7–6.4)

## 2023-09-22 MED ORDER — ALBUTEROL SULFATE HFA 108 (90 BASE) MCG/ACT IN AERS: 2.0000 | INHALATION_SPRAY | Freq: Four times a day (QID) | RESPIRATORY_TRACT | 2 refills | Status: AC | PRN

## 2023-09-22 NOTE — Progress Notes (Signed)
    SUBJECTIVE:   CHIEF COMPLAINT / HPI:   48 year old female with history of carpal tunnel syndrome Presenting today for annual physical  Notes good appetite, regular food.  Occasionally indulge in intermittent fasting and most time could mix prefers.  Working on dieting to help with weight loss.  Sleeps about 5 hours nightly.  Only gets a 2-hour nap around 6 PM and so falls asleep sometime around midnight but wakes up at 3-4 a.m. from daughter waking her up.  Not sexually active.  LMP about 5 years ago.  Denies any hot flashes, night sweats or mood swings.  Tobacco, alcohol, marijuana or illicit drug use.  Lives with her 23 year old daughter.  Works as a Clinical biochemist.  Medical concerns 1) Reports no menses in over 5 years.  Denies having any hot flashes, night sweats or mood changes.  Currently not on birth control. Never had a perimenopausal workup.  Before that she had had regular periods lasting 3 to 5 days.  2) intermittent right lower abdominal pain.  Sleep presents with abdominal exercise or occasionally exacerbated by positional changes.  Pain is usually sharp and nonradiating.  Denies any fever, chills, or abdominal distention.  Does have history of hernia surgery years ago.  Reports normal bowel movements.   PERTINENT  PMH / PSH: Reviewed   OBJECTIVE:   BP 125/80   Pulse 80   Temp 98.2 F (36.8 C)   Wt 160 lb 6.4 oz (72.8 kg)   LMP  (LMP Unknown)   SpO2 98%   BMI 32.40 kg/m    Physical Exam General: Alert, well appearing, NAD Cardiovascular: RRR, No Murmurs, Normal S2/S2 Respiratory: CTAB, No wheezing or Rales Abdomen: No distension or tenderness Extremities: No edema on extremities   Skin: Warm and dry  ASSESSMENT/PLAN:   Perimenopausal Given age of 37 suspect amenorrhea is likely due to menopause.  Although patient's does not show other signs of hot flashes or mood changes.  No history of perimenopausal workup.  Will obtain prolactin, TSH and FSH levels  today.  Annual physical exam Reviewed patient's Family Medical History Reviewed and updated list of patient's medical providers Counseled patient on tobacco, alcohol use Recommend moderate exercise at least 150 hours a week Emphasized need for healthy dieting Assessment of cognitive impairment was done Assessed patient's functional ability Health Risk Assessent Completed and Reviewed. Discussed sleeping hygiene which includes attempting to go to bed around 8 pm and avoiding 6 PM naps.  Considered the following screening exams based upon USPSTF recommendations: Diabetes screening: A1c ordered ordered Screening for elevated cholesterol: Panel ordered HIV testing: N/A Hepatitis C: N/A Hepatitis B: N/A Syphilis if at high risk: N/A Reviewed risk factors for latent tuberculosis and not indicated Colorectal cancer screening: Not age-appropriate   Jerre Simon, MD [facility]

## 2023-09-22 NOTE — Assessment & Plan Note (Signed)
 Reviewed patient's Family Medical History Reviewed and updated list of patient's medical providers Counseled patient on tobacco, alcohol use Recommend moderate exercise at least 150 hours a week Emphasized need for healthy dieting Assessment of cognitive impairment was done Assessed patient's functional ability Health Risk Assessent Completed and Reviewed. Discussed sleeping hygiene which includes attempting to go to bed around 8 pm and avoiding 6 PM naps.  Considered the following screening exams based upon USPSTF recommendations: Diabetes screening: A1c ordered ordered Screening for elevated cholesterol: Panel ordered HIV testing: N/A Hepatitis C: N/A Hepatitis B: N/A Syphilis if at high risk: N/A Reviewed risk factors for latent tuberculosis and not indicated Colorectal cancer screening: Not age-appropriate

## 2023-09-22 NOTE — Assessment & Plan Note (Signed)
 Given age of 24 suspect amenorrhea is likely due to menopause.  Although patient's does not show other signs of hot flashes or mood changes.  No history of perimenopausal workup.  Will obtain prolactin, TSH and FSH levels today.

## 2023-09-22 NOTE — Patient Instructions (Signed)
 Pleasure to meet you today.  I suspect your lack of period is most likely due to premenopausal symptoms. Placed orders including thyroid to evaluate for your lack of periods for over the last few years.  Abdominal pain you are having I am suspecting this is most likely due to muscle sprain also could be pain from your hernia surgery either from the tissue formations from the surgery or the mesh used to act as a barried from the abdominal wall.  Can try to use Voltaren gel or lidocaine patch over the area.  Please make sure to return if this pain persist or gets worse.  All the labs we ordered today including A1c to test your sugar and panel to check your cholesterol levels.  You are currently due for your Pap smear please schedule an appointment to have this completed.

## 2023-09-23 LAB — LIPID PANEL
Chol/HDL Ratio: 4.8 ratio — ABNORMAL HIGH (ref 0.0–4.4)
Cholesterol, Total: 280 mg/dL — ABNORMAL HIGH (ref 100–199)
HDL: 58 mg/dL (ref >39)
LDL Chol Calc (NIH): 203 mg/dL — ABNORMAL HIGH (ref 0–99)
Triglycerides: 109 mg/dL (ref 0–149)
VLDL Cholesterol Cal: 19 mg/dL (ref 5–40)

## 2023-09-23 LAB — TSH: TSH: 1.33 u[IU]/mL (ref 0.450–4.500)

## 2023-09-23 LAB — FSH/LH
FSH: 45.9 m[IU]/mL
LH: 34.1 m[IU]/mL

## 2023-09-23 LAB — PROLACTIN: Prolactin: 8 ng/mL (ref 4.8–33.4)

## 2023-09-24 ENCOUNTER — Telehealth: Payer: Self-pay | Admitting: Student

## 2023-09-24 NOTE — Telephone Encounter (Signed)
 Called patient to discuss lab result but unable to reach patient. LVM

## 2023-11-11 ENCOUNTER — Other Ambulatory Visit: Payer: Self-pay | Admitting: Surgery

## 2023-11-11 DIAGNOSIS — Z1231 Encounter for screening mammogram for malignant neoplasm of breast: Secondary | ICD-10-CM

## 2023-11-13 ENCOUNTER — Ambulatory Visit
Admission: RE | Admit: 2023-11-13 | Discharge: 2023-11-13 | Disposition: A | Discharge status: Home / Self Care | Source: Ambulatory Visit | Attending: Surgery | Admitting: Surgery

## 2023-11-13 DIAGNOSIS — Z1231 Encounter for screening mammogram for malignant neoplasm of breast: Secondary | ICD-10-CM
